# Patient Record
Sex: Male | Born: 1990 | Race: Black or African American | Hispanic: No | Marital: Single | Smoking: Never smoker
Health system: Southern US, Community
[De-identification: ages and names within clinical notes are randomized; demographics above are authoritative.]

## PROBLEM LIST (undated history)

## (undated) DIAGNOSIS — G43909 Migraine, unspecified, not intractable, without status migrainosus: Secondary | ICD-10-CM

---
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
## 2012-03-29 ENCOUNTER — Encounter (HOSPITAL_COMMUNITY): Payer: Self-pay | Admitting: *Deleted

## 2012-03-29 ENCOUNTER — Emergency Department (HOSPITAL_COMMUNITY): Payer: Self-pay

## 2012-03-29 ENCOUNTER — Emergency Department (HOSPITAL_COMMUNITY)
Admission: EM | Admit: 2012-03-29 | Discharge: 2012-03-30 | Disposition: A | Discharge status: Home / Self Care | Payer: Self-pay | Attending: Emergency Medicine | Admitting: Emergency Medicine

## 2012-03-29 DIAGNOSIS — S0510XA Contusion of eyeball and orbital tissues, unspecified eye, initial encounter: Secondary | ICD-10-CM | POA: Insufficient documentation

## 2012-03-29 DIAGNOSIS — S022XXA Fracture of nasal bones, initial encounter for closed fracture: Secondary | ICD-10-CM | POA: Insufficient documentation

## 2012-03-29 DIAGNOSIS — S0590XA Unspecified injury of unspecified eye and orbit, initial encounter: Secondary | ICD-10-CM

## 2012-03-29 DIAGNOSIS — S0530XA Ocular laceration without prolapse or loss of intraocular tissue, unspecified eye, initial encounter: Secondary | ICD-10-CM | POA: Insufficient documentation

## 2012-03-29 MED ORDER — IBUPROFEN 400 MG PO TABS
800.0000 mg | ORAL_TABLET | Freq: Once | ORAL | Status: AC
  Administered 2012-03-29: 800 mg via ORAL
  Filled 2012-03-29: qty 2

## 2012-03-29 MED ORDER — TETRACAINE HCL 0.5 % OP SOLN
2.0000 [drp] | Freq: Once | OPHTHALMIC | Status: AC
  Administered 2012-03-29: 2 [drp] via OPHTHALMIC
  Filled 2012-03-29: qty 2

## 2012-03-29 MED ORDER — OXYCODONE-ACETAMINOPHEN 5-325 MG PO TABS
1.0000 | ORAL_TABLET | Freq: Once | ORAL | Status: AC
  Administered 2012-03-29: 1 via ORAL
  Filled 2012-03-29: qty 1

## 2012-03-29 MED ORDER — OXYCODONE-ACETAMINOPHEN 5-325 MG PO TABS: 1.0000 | ORAL_TABLET | ORAL | Status: DC | PRN
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
## 2012-03-30 NOTE — ED Provider Notes (Signed)
Medical screening examination/treatment/procedure(s) were conducted as a shared visit with non-physician practitioner(s) and myself.  I personally evaluated the patient during the encounter. Right eye periorbital swelling and ecchymosis without hyphema, with significant soft tissue swelling, extraocular movements intact without entrapment, vision intact. Pain was addressed , Prescriptions provided, outpatient followup referrals provided - ophthalmology and maxillofacial on-call Dr. Emeline Darling. All of patient's questions answered and patient states understanding all discharge and followup instructions  Sunnie Nielsen, MD 03/30/12 [phone]

## 2012-04-01 ENCOUNTER — Encounter (HOSPITAL_COMMUNITY): Payer: Self-pay | Admitting: Nurse Practitioner

## 2012-04-01 ENCOUNTER — Emergency Department (HOSPITAL_COMMUNITY)
Admission: EM | Admit: 2012-04-01 | Discharge: 2012-04-01 | Disposition: A | Discharge status: Home / Self Care | Payer: Self-pay | Attending: Emergency Medicine | Admitting: Emergency Medicine

## 2012-04-01 DIAGNOSIS — R110 Nausea: Secondary | ICD-10-CM | POA: Insufficient documentation

## 2012-04-01 DIAGNOSIS — S0280XA Fracture of other specified skull and facial bones, unspecified side, initial encounter for closed fracture: Secondary | ICD-10-CM | POA: Insufficient documentation

## 2012-04-01 DIAGNOSIS — S0292XA Unspecified fracture of facial bones, initial encounter for closed fracture: Secondary | ICD-10-CM

## 2012-04-01 DIAGNOSIS — R519 Headache, unspecified: Secondary | ICD-10-CM

## 2012-04-01 DIAGNOSIS — S0993XA Unspecified injury of face, initial encounter: Secondary | ICD-10-CM | POA: Insufficient documentation

## 2012-04-01 MED ORDER — IBUPROFEN 400 MG PO TABS
600.0000 mg | ORAL_TABLET | Freq: Once | ORAL | Status: AC
  Administered 2012-04-01: 600 mg via ORAL
  Filled 2012-04-01: qty 1

## 2012-04-01 MED ORDER — HYDROMORPHONE HCL PF 1 MG/ML IJ SOLN
1.0000 mg | Freq: Once | INTRAMUSCULAR | Status: AC
  Administered 2012-04-01: 1 mg via INTRAMUSCULAR
  Filled 2012-04-01: qty 1

## 2012-04-01 MED ORDER — NAPROXEN 500 MG PO TABS: 500.0000 mg | ORAL_TABLET | Freq: Two times a day (BID) | ORAL | Status: DC | PRN

## 2012-04-01 MED ORDER — OXYCODONE-ACETAMINOPHEN 5-325 MG PO TABS: 1.0000 | ORAL_TABLET | ORAL | Status: DC | PRN
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
## 2012-12-14 ENCOUNTER — Encounter (HOSPITAL_COMMUNITY): Payer: Self-pay | Admitting: Emergency Medicine

## 2012-12-14 ENCOUNTER — Emergency Department (HOSPITAL_COMMUNITY)
Admission: EM | Admit: 2012-12-14 | Discharge: 2012-12-15 | Disposition: A | Discharge status: Home / Self Care | Payer: Self-pay | Attending: Emergency Medicine | Admitting: Emergency Medicine

## 2012-12-14 DIAGNOSIS — Y9367 Activity, basketball: Secondary | ICD-10-CM | POA: Insufficient documentation

## 2012-12-14 DIAGNOSIS — Y9239 Other specified sports and athletic area as the place of occurrence of the external cause: Secondary | ICD-10-CM | POA: Insufficient documentation

## 2012-12-14 DIAGNOSIS — Z23 Encounter for immunization: Secondary | ICD-10-CM | POA: Insufficient documentation

## 2012-12-14 DIAGNOSIS — S0181XA Laceration without foreign body of other part of head, initial encounter: Secondary | ICD-10-CM

## 2012-12-14 DIAGNOSIS — W219XXA Striking against or struck by unspecified sports equipment, initial encounter: Secondary | ICD-10-CM | POA: Insufficient documentation

## 2012-12-14 DIAGNOSIS — S058X9A Other injuries of unspecified eye and orbit, initial encounter: Secondary | ICD-10-CM | POA: Insufficient documentation

## 2012-12-14 MED ORDER — ACETAMINOPHEN 325 MG PO TABS
650.0000 mg | ORAL_TABLET | Freq: Once | ORAL | Status: AC
  Administered 2012-12-14: 650 mg via ORAL
  Filled 2012-12-14: qty 2

## 2012-12-14 MED ORDER — TETANUS-DIPHTH-ACELL PERTUSSIS 5-2.5-18.5 LF-MCG/0.5 IM SUSP
0.5000 mL | Freq: Once | INTRAMUSCULAR | Status: AC
  Administered 2012-12-14: 0.5 mL via INTRAMUSCULAR
  Filled 2012-12-14: qty 0.5

## 2012-12-14 NOTE — ED Notes (Signed)
Pt. presents with laceration at right lateral eyelid approx. 1/2 inch sustained while playing basketball ( hit a pole)  this evening . No LOC / ambulatory . Bleeding controlled. Alert and oriented .

## 2012-12-14 NOTE — ED Provider Notes (Signed)
CSN: [id number]     Arrival date & time 12/14/12  2114 History  This chart was scribed for non-physician practitioner Jaynie Crumble, PA-C, working with Layla Maw Ward, DO by Dorothey Baseman, ED Scribe. This patient was seen in room TR09C/TR09C and the patient's care was started at 11:03 PM.    Chief Complaint  Patient presents with  . Laceration   The history is provided by the patient. No language interpreter was used.   HPI Comments: Brad King is a 22 y.o. male who presents to the Emergency Department complaining of a laceration with associated mild pain to the right upper eyelid area that occurred while he was playing basketball and hit a pole PTA. There is no active bleeding at this time. He denies loss of consciousness or blurred vision. He reports associated headache. He states that he has not taken anything at home to treat the pain symptoms. Unknown tetanus. Nothing making his symptoms better or worse    History reviewed. No pertinent past medical history. History reviewed. No pertinent past surgical history. No family history on file. History  Substance Use Topics  . Smoking status: Never Smoker   . Smokeless tobacco: Not on file  . Alcohol Use: No    Review of Systems  Skin: Positive for wound.  Neurological: Positive for headaches. Negative for syncope.  All other systems reviewed and are negative.    Allergies  Review of patient's allergies indicates no known allergies.  Home Medications   Current Outpatient Rx  Name  Route  Sig  Dispense  Refill  . ibuprofen (ADVIL,MOTRIN) 200 MG tablet   Oral   Take 400 mg by mouth every 6 (six) hours as needed. For pain         . naproxen (NAPROSYN) 500 MG tablet   Oral   Take 1 tablet (500 mg total) by mouth 2 (two) times daily as needed.   20 tablet   0   . oxyCODONE-acetaminophen (PERCOCET/ROXICET) 5-325 MG per tablet   Oral   Take 1 tablet by mouth every 4 (four) hours as needed for pain.   15 tablet   0    . oxyCODONE-acetaminophen (PERCOCET/ROXICET) 5-325 MG per tablet   Oral   Take 1-2 tablets by mouth every 4 (four) hours as needed for pain.   30 tablet   0    Triage Vitals: BP 145/85  Pulse 95  Temp(Src) 98.8 F (37.1 C) (Oral)  Resp 20  SpO2 97%  Physical Exam  Nursing note and vitals reviewed. Constitutional: He is oriented to person, place, and time. He appears well-developed and well-nourished. No distress.  HENT:  Head: Normocephalic and atraumatic.  Eyes: Conjunctivae are normal.  Neck: Normal range of motion. Neck supple.  Musculoskeletal: Normal range of motion.  Neurological: He is alert and oriented to person, place, and time.  Skin: Skin is warm and dry.  1 cm laceration just below the right eyebrow that is non-gaping and hemostatic.   Psychiatric: He has a normal mood and affect. His behavior is normal.    ED Course  Procedures (including critical care time)  Medications  TDaP (BOOSTRIX) injection 0.5 mL (0.5 mLs Intramuscular Given 12/14/12 2339)  acetaminophen (TYLENOL) tablet 650 mg (650 mg Oral Given 12/14/12 2339)   DIAGNOSTIC STUDIES: Oxygen Saturation is 97% on room air, normal by my interpretation.    COORDINATION OF CARE: 11:04PM- Discussed that sutures will not be necessary and that the laceration will be closed with dermabond. Discussed  treatment plan with patient at bedside and patient verbalized agreement.    LACERATION REPAIR Performed by: Lottie Mussel Authorized by: Jaynie Crumble A Consent: Verbal consent obtained. Risks and benefits: risks, benefits and alternatives were discussed Consent given by: patient Patient identity confirmed: provided demographic data Prepped and Draped in normal sterile fashion Wound explored  Laceration Location: right eyebrow  Laceration Length: 1cm  No Foreign Bodies seen or palpated  Anesthesia: none  Irrigation method: syringe Amount of cleaning: standard  Skin closure: tissue  adhesive  Patient tolerance: Patient tolerated the procedure well with no immediate complications.  Labs Review Labs Reviewed - No data to display Imaging Review No results found.  MDM   1. Laceration of face, initial encounter     Patient with a superficial laceration to the right eyebrow. The wound was non-gaping and superficial. It was repaired using Dermabond. Patient has noted evidence of any major intracranial trauma. He is nontoxic appearing. He was given Tylenol for his headache. Tetanus was updated. He was discharged home with close followup as needed.  Filed Vitals:   12/14/12 2120  BP: 145/85  Pulse: 95  Temp: 98.8 F (37.1 C)  TempSrc: Oral  Resp: 20  SpO2: 97%   I personally performed the services described in this documentation, which was scribed in my presence. The recorded information has been reviewed and is accurate.   Lottie Mussel, PA-C 12/15/12 0005

## 2012-12-15 NOTE — ED Provider Notes (Signed)
Medical screening examination/treatment/procedure(s) were performed by non-physician practitioner and as supervising physician I was immediately available for consultation/collaboration.  Zimri Brennen N Chau Savell, DO 12/15/12 1457 

## 2014-02-04 ENCOUNTER — Emergency Department (HOSPITAL_COMMUNITY)
Admission: EM | Admit: 2014-02-04 | Discharge: 2014-02-04 | Disposition: A | Discharge status: Home / Self Care | Payer: Self-pay | Attending: Emergency Medicine | Admitting: Emergency Medicine

## 2014-02-04 ENCOUNTER — Encounter (HOSPITAL_COMMUNITY): Payer: Self-pay | Admitting: *Deleted

## 2014-02-04 DIAGNOSIS — G43909 Migraine, unspecified, not intractable, without status migrainosus: Secondary | ICD-10-CM | POA: Insufficient documentation

## 2014-02-04 DIAGNOSIS — J029 Acute pharyngitis, unspecified: Secondary | ICD-10-CM | POA: Insufficient documentation

## 2014-02-04 DIAGNOSIS — G43009 Migraine without aura, not intractable, without status migrainosus: Secondary | ICD-10-CM

## 2014-02-04 HISTORY — DX: Migraine, unspecified, not intractable, without status migrainosus: G43.909

## 2014-02-04 LAB — RAPID STREP SCREEN (MED CTR MEBANE ONLY): STREPTOCOCCUS, GROUP A SCREEN (DIRECT): NEGATIVE

## 2014-02-04 MED ORDER — DIPHENHYDRAMINE HCL 50 MG/ML IJ SOLN
25.0000 mg | Freq: Once | INTRAMUSCULAR | Status: AC
  Administered 2014-02-04: 25 mg via INTRAVENOUS
  Filled 2014-02-04: qty 1

## 2014-02-04 MED ORDER — SODIUM CHLORIDE 0.9 % IV SOLN: INTRAVENOUS | Status: DC

## 2014-02-04 MED ORDER — PROMETHAZINE HCL 25 MG/ML IJ SOLN
12.5000 mg | Freq: Once | INTRAMUSCULAR | Status: AC
  Administered 2014-02-04: 12.5 mg via INTRAVENOUS
  Filled 2014-02-04: qty 1

## 2014-02-04 MED ORDER — SODIUM CHLORIDE 0.9 % IV BOLUS (SEPSIS)
1000.0000 mL | Freq: Once | INTRAVENOUS | Status: AC
  Administered 2014-02-04: 1000 mL via INTRAVENOUS

## 2014-02-04 MED ORDER — KETOROLAC TROMETHAMINE 30 MG/ML IJ SOLN
30.0000 mg | Freq: Once | INTRAMUSCULAR | Status: AC
  Administered 2014-02-04: 30 mg via INTRAVENOUS
  Filled 2014-02-04: qty 1

## 2014-02-04 MED ORDER — DEXAMETHASONE SODIUM PHOSPHATE 10 MG/ML IJ SOLN
10.0000 mg | Freq: Once | INTRAMUSCULAR | Status: AC
  Administered 2014-02-04: 10 mg via INTRAVENOUS
  Filled 2014-02-04: qty 1

## 2014-02-04 NOTE — ED Provider Notes (Addendum)
CSN: [id number]     Arrival date & time 02/04/14  1843 History   First MD Initiated Contact with Patient 02/04/14 1954     Chief Complaint  Patient presents with  . Migraine  . Sore Throat     (Consider location/radiation/quality/duration/timing/severity/associated sxs/prior Treatment) Patient is a 23 y.o. male presenting with migraines and pharyngitis. The history is provided by the patient.  Migraine Pertinent negatives include no abdominal pain, no headaches and no shortness of breath.  Sore Throat Pertinent negatives include no abdominal pain, no headaches and no shortness of breath.  patient with history of migraines. Headache started 4 days ago. Right-sided typical for past migraines. Throbbing in nature no vomiting but has nausea. Associated with photophobia. Patient also concerned about swelling of the uvula. Uncomfortable when he swallows but not painful. Patient is getting over an upper respiratory infection. No fevers. No focal neuro deficits. Headache is throbbing in nature. 10 out of 10. Nonradiating. Now predominantly on the right occipital area. Patient's last migraine was 2 months ago.  Past Medical History  Diagnosis Date  . Migraines    History reviewed. No pertinent past surgical history. No family history on file. History  Substance Use Topics  . Smoking status: Never Smoker   . Smokeless tobacco: Not on file  . Alcohol Use: No    Review of Systems  Constitutional: Negative for fever.  HENT: Positive for congestion. Negative for sore throat.   Eyes: Positive for photophobia.  Respiratory: Negative for shortness of breath.   Gastrointestinal: Negative for nausea, vomiting and abdominal pain.  Genitourinary: Negative for dysuria.  Musculoskeletal: Negative for back pain.  Skin: Negative for rash.  Neurological: Negative for weakness, numbness and headaches.  Hematological: Does not bruise/bleed easily.  Psychiatric/Behavioral: Negative for confusion.       Allergies  Review of patient's allergies indicates no known allergies.  Home Medications   Prior to Admission medications   Not on File   BP 132/87 mmHg  Pulse 61  Temp(Src) 98.2 F (36.8 C) (Oral)  Resp 16  Ht 5\' 10"  (1.778 m)  Wt 240 lb (108.863 kg)  BMI 34.44 kg/m2  SpO2 98% Physical Exam  Constitutional: He is oriented to person, place, and time. He appears well-developed and well-nourished. No distress.  HENT:  Head: Normocephalic and atraumatic.  Mouth/Throat: Oropharynx is clear and moist. No oropharyngeal exudate.  Uvula without significant objective swelling.  Eyes: Conjunctivae and EOM are normal. Pupils are equal, round, and reactive to light.  Neck: Normal range of motion.  Cardiovascular: Normal rate.   No murmur heard. Pulmonary/Chest: Effort normal and breath sounds normal. No respiratory distress.  Abdominal: Soft. Bowel sounds are normal. There is no tenderness.  Musculoskeletal: Normal range of motion. He exhibits no edema.  Neurological: He is alert and oriented to person, place, and time. No cranial nerve deficit. He exhibits normal muscle tone. Coordination normal.  Skin: Skin is warm. No rash noted.  Nursing note and vitals reviewed.   ED Course  Procedures (including critical care time) Labs Review Labs Reviewed  RAPID STREP SCREEN  CULTURE, GROUP A STREP    Imaging Review No results found.   EKG Interpretation None      MDM   Final diagnoses:  Nonintractable migraine, unspecified migraine type    Patient with history of migraines. This migraine is typical. Right-sided present for 4 days. Currently 10 out of 10 throbbing in nature. Associated with photophobia no nausea no vomiting. No fevers. Treated with  migraine cocktail with some improvement. Patient is nontoxic no acute distress. Patient also with the subjective feeling of swelling to the uvula. Objectively it does not appear that swollen. But the Decadron used for the  migraine cocktail should help improve this. Patient is getting over an upper respiratory infection. No significant exudates or significant erythema or swelling in the tonsil area.  Toradol added on to the regiment. Patient with some improvement with the regular regiment. The LAD Toradol to try to get a little more immediate relief. Patient certainly no worse. No focal deficits. Patient nontoxic.  Vanetta MuldersScott Sabir Charters, MD 02/04/14 [phone]    Vanetta MuldersScott Gatlyn Lipari, MD 02/04/14 2209

## 2014-02-04 NOTE — ED Notes (Addendum)
Pt c/o R sided headache that increases with movement.  Pt also c/o vomiting and photophobia.  Pt also states his uvula is swollen and hurts when he swallows.  Redness noted, but no swelling.  Airway patent.

## 2014-02-04 NOTE — Discharge Instructions (Signed)
Migraine Headache A migraine headache is very bad, throbbing pain on one or both sides of your head. Talk to your doctor about what things may bring on (trigger) your migraine headaches. HOME CARE  Only take medicines as told by your doctor.  Lie down in a dark, quiet room when you have a migraine.  Keep a journal to find out if certain things bring on migraine headaches. For example, write down:  What you eat and drink.  How much sleep you get.  Any change to your diet or medicines.  Lessen how much alcohol you drink.  Quit smoking if you smoke.  Get enough sleep.  Lessen any stress in your life.  Keep lights dim if bright lights bother you or make your migraines worse. GET HELP RIGHT AWAY IF:   Your migraine becomes really bad.  You have a fever.  You have a stiff neck.  You have trouble seeing.  Your muscles are weak, or you lose muscle control.  You lose your balance or have trouble walking.  You feel like you will pass out (faint), or you pass out.  You have really bad symptoms that are different than your first symptoms. MAKE SURE YOU:   Understand these instructions.  Will watch your condition.  Will get help right away if you are not doing well or get worse. Document Released: 12/23/2007 Document Revised: 06/07/2011 Document Reviewed: 11/20/2012 [facility] Patient Information 2015 [employer], [place]. This information is not intended to replace advice given to you by your health care provider. Make sure you discuss any questions you have with your health care provider.   Emergency Department Resource Guide 1) Find a Doctor and Pay Out of Pocket Although you won't have to find out who is covered by your insurance plan, it is a good idea to ask around and get recommendations. You will then need to call the office and see if the doctor you have chosen will accept you as a new patient and what types of options they offer for patients who are self-pay. Some doctors  offer discounts or will set up payment plans for their patients who do not have insurance, but you will need to ask so you aren't surprised when you get to your appointment.  2) Contact Your Local Health Department Not all health departments have doctors that can see patients for sick visits, but many do, so it is worth a call to see if yours does. If you don't know where your local health department is, you can check in your phone book. The CDC also has a tool to help you locate your state's health department, and many state websites also have listings of all of their local health departments.  3) Find a Walk-in Clinic If your illness is not likely to be very severe or complicated, you may want to try a walk in clinic. These are popping up all over the country in pharmacies, drugstores, and shopping centers. They're usually staffed by nurse practitioners or physician assistants that have been trained to treat common illnesses and complaints. They're usually fairly quick and inexpensive. However, if you have serious medical issues or chronic medical problems, these are probably not your best option.  No Primary Care Doctor: - Call Health Connect at  [phone] - they can help you locate a primary care doctor that  accepts your insurance, provides certain services, etc. - Physician Referral Service- [phone]  Chronic Pain Problems: Organization         Address  Phone   Notes  °[facility]  (336) 297-2271 Patients need to be referred by their primary care doctor.  ° °Medication Assistance: °Organization         Address  Phone   Notes  °[place] [facility] [address] °[place], [place] [address] (336) 641-8030 --Must be a resident of [place] °-- Must have NO insurance coverage whatsoever (no Medicaid/ Medicare, etc.) °-- The pt. MUST have a primary care doctor that directs their care regularly and follows them in the community °   °[facility]  (866) 331-1348   °[employer] Way  (888) 892-1162   ° °Agencies that provide inexpensive medical care: °Organization         Address  Phone   Notes  °[facility]  (336) 832-8035   °[facility]    (336) 832-7272   °[facility] [address] °[place], [place] [address] (336) 832-4777   °[facility] [address], °[place] (336) 271-4999   °[facility]    (336) 373-0678   °[facility]    (336) 272-1050   °[facility] ° [address], [place] Phone:  (336) 832-4444, Fax:  (336) 832-4440 Hours of Operation:  9 am - 6 pm, M-F.  Also accepts Medicaid/Medicare and self-pay.  °[facility] ° [address], [place] Phone: (336) 832-3150, Fax: (336) 832-3151. Hours of Operation:  8:30 am - 5:30 pm, M-F.  Also accepts Medicaid and self-pay.  °[facility] [place] [address], [place] Phone: (336) 878-6027   °[facility] [address], [place], [place] (336)723-1848, Ext. 123 Mondays & Thursdays: 7-9 AM.  First 15 patients are seen on a first come, first serve basis. °  ° °Medicaid-accepting [place] Providers: ° °Organization         Address  Phone   Notes  °[facility] [address], [place] (336) 641-2100 Also accepts self-pay patients.  °[facility] [address], [place] ° (336) 856-9996   °[facility] [address], [place] (336) 288-8857   °[facility] [address] [place] [address], [place] (336) 299-7000   °Veita Bland [address], [place]  ° (336) 373-1557 Only accepts [place] Access Medicaid patients after they have their name applied to their card.  ° °Self-Pay (no insurance) in [place]: ° °Organization         Address  Phone   Notes  °Sickle Cell Patients, [facility] [address], [place] (336) 832-1970   °[facility] [address], [place] (336) 832-4400   °[facility] ° [address], [place] (336) 992-4800   °[facility]/Dr. Osei-Bonsu ° [address] [place] [address], [place] or [address], [place] (336) 841-8500 Phone number for both [place] and [place] locations is the same.  °[facility] [address], [place] (336) 299-0000   °[facility]  [address] [place] [address],  or [address] (336) 852-7530 °(336) 878-2260   °[facility] [address],  (336) 350-1642, phone; (336) 294-5005, fax Sees patients 1st and 3rd Saturday of every month.  Must not qualify for public   or private insurance (i.e. Medicaid, Medicare, [place] Health Choice, Veterans' Benefits)  Household income should be no more than 200% of the poverty level The clinic cannot treat you if you are pregnant or think you are pregnant  Sexually transmitted diseases are not treated at the clinic.    Dental Care: Organization         Address  Phone  Notes  [facility] [place] [phone] Accepts children up to age 66 who are enrolled in [place] or [place]; pregnant women with a Medicaid card; and children who have applied for Medicaid or [place] Health Choice, but were declined, whose parents can pay a reduced fee at time of service.  [facility]  [address], [place] (361) 499-3467 Accepts children up to age 38 who are enrolled in [place] or [place]; pregnant women with a Medicaid card; and children who have applied for Medicaid or [place] Health Choice, but were declined, whose parents can pay a reduced fee at time of service.  [facility]  [place] (501)159-6478 Patients are seen by appointment only. Walk-ins are not accepted. [place] will see patients 32 years of age and older. Monday - Tuesday (8am-5pm) Most Wednesdays (8:30-5pm) $30 per visit, cash only  [facility]  [address], [facility] [phone] Patients are seen by appointment only. Walk-ins are not accepted. [place] will see patients 40 years of age and older. One Wednesday Evening (Monthly: Volunteer Based).  $30 per visit, cash only  [place]  [phone] for adults; Children under age 59, call Graduate Pediatric Dentistry at ([phone]. Children aged 66-14, please call [phone] to request a pediatric application.  Dental services are provided in all areas of dental care including fillings, crowns and bridges, complete and partial dentures, implants, gum treatment, root canals, and extractions. Preventive care is also provided. Treatment is provided to both adults and children. Patients are selected via a lottery and there is often a waiting list.   [facility] [address], [place]  (804)[phone] [email or web address]   [facility] [address] [place], [place] [phone], Ext. 123 Second and Fourth Thursday of each month, opens at 6:30 AM; Clinic ends at 9 AM.  Patients are seen on a first-come first-served basis, and a limited number are seen during each clinic.   [facility]  [address] [place] D[place], [place] [phone]   Eligibility Requirements You must have lived in [place], [place], or [place] counties for at least the last three months.   You cannot be eligible for state or federal sponsored [employer], including [employer], [place], or [employer].   You generally cannot be eligible for healthcare insurance through your employer.    How to apply: Eligibility screenings are held every Tuesday and  Wednesday afternoon from 1:00 pm until 4:00 pm. You do not need an appointment for the interview!  [facility] [address], [place], [place]   [place]  [place] Department  [place]  [phone]    Behavioral Health Resources in the Community: Intensive Outpatient Programs Organization         Address  Phone  Notes  [facility] [address], [place], [place] 336-878-6098   °[facility] [address], [place], [place] 336-832-9800   °[facility] [address], [place], [place] ° 336-882-2125   °[facility] [address],  °[place], [place] 1-800-853-5163 or 336-641-4981   °Substance Abuse Resources °Organization         Address  Phone  Notes  °[facility]  336-882-2125   °[facility]  336-784-9470   °[facility]  336-285-9073   °[facility]  336-845-3988   °Residential & Outpatient Substance Abuse Program  1-800-659-3381   °Psychological Services °Organization         Address  Phone  Notes  °[facility]  336- 832-9600   °[facility]  336- 378-7881   °[facility] [address], [place] 1-800-853-5163 or 336-641-4981   ° °Mobile Crisis Teams °Organization         Address  Phone  Notes  °[facility], Mobile Crisis Care Unit  1-877-626-1772   °Assertive °[facility] ° [address] [place], [place] 336-834-9664   °Sharon DeEsch [address] °[place] 336-554-5454   ° °Self-Help/Support Groups °Organization         Address  Phone             Notes  °[facility] [place] - variety of support groups  336- 373-1402 Call for more information  °Narcotics Anonymous (NA), [facility] [address], °[place]  2 meetings at this location  ° °Residential  Treatment Programs °Organization         Address  Phone  Notes  °[facility] [address],    °[place]  1-866-801-8205   °[facility] ° [address], [place], [place] 704-293-8524   °[facility] [address], [place] 336-845-3988 Admissions: 8am-3pm M-F  °[facility] [address]    °[place], [place] 336-841-1104   °[facility] [address], [place], [place] 336-379-7146   °[facility] [address]  °[place], [place] 336-285-9073   °[facility] - Intensive Outpatient [address], [place], [place] 336-852-3033   °[facility] [address]  °[place], [place] 1-877-615-2722 or 336-784-9470   °[facility] ([facility]) [address] [place], [place] 336-227-7417 Accepts Medicaid  °[facility] [address]  ° [place] 1-800-659-3381 Substance Abuse/Addiction Treatment  ° °[place] Behavioral Health Resources °Organization         Address  Phone  Notes  °[facility]  (888) 581-9988   °Julie Brannon, PhD [address] [place], [place]   (336) 349-5553 or (336) 951-0000   °[facility]   [address] °[place], [place] (336) 349-4454   °[facility] [address], [place], [place] (336) 342-8316 Insurance/Medicaid/sponsorship through [facility]  °[facility] [address]                                    [place], [place] (336) 342-8316 Therapy/tele-psych/case  °[facility] [address]  ° , [place] (336) 349-2233    °Dr. Arfeen  (336) 349-4544   °[facility] [place]  [employer] [place] [facility]   Dept. 1) [address], [place] 2) [address], [place] 3)  [address], [place] [phone](336) 270-377-4389 [phone](336) 205-596-6782  850-804-4398(336) [phone]   [facility] Child Abuse Hotline 716 085 6661(336) (519)063-1769 or (251)440-2096(336) 506-489-2636 (After Hours)       Information and resource guide provided above. Go home and sleep is much as possible. Return for any new or worse symptoms. Would recommend follow-up with the wellness clinic as listed in the information above.

## 2014-02-06 LAB — CULTURE, GROUP A STREP

## 2015-05-12 ENCOUNTER — Encounter (HOSPITAL_COMMUNITY): Payer: Self-pay | Admitting: Emergency Medicine

## 2015-05-12 ENCOUNTER — Emergency Department (HOSPITAL_COMMUNITY)
Admission: EM | Admit: 2015-05-12 | Discharge: 2015-05-12 | Disposition: A | Discharge status: Home / Self Care | Payer: Self-pay | Attending: Emergency Medicine | Admitting: Emergency Medicine

## 2015-05-12 DIAGNOSIS — Z8679 Personal history of other diseases of the circulatory system: Secondary | ICD-10-CM | POA: Insufficient documentation

## 2015-05-12 DIAGNOSIS — M62830 Muscle spasm of back: Secondary | ICD-10-CM | POA: Insufficient documentation

## 2015-05-12 MED ORDER — NAPROXEN 500 MG PO TABS: 500.0000 mg | ORAL_TABLET | Freq: Two times a day (BID) | ORAL | Status: DC

## 2015-05-12 MED ORDER — METHOCARBAMOL 500 MG PO TABS: 500.0000 mg | ORAL_TABLET | Freq: Two times a day (BID) | ORAL | Status: DC

## 2015-05-12 NOTE — ED Notes (Signed)
Pt states he was wrestling with daughter last night and then started having a pain in his back on the right lower to mid side. Pt states it "feels like i pulled a muscle".

## 2015-05-12 NOTE — ED Notes (Signed)
Declined W/C at D/C and was escorted to lobby by RN. 

## 2015-05-12 NOTE — ED Provider Notes (Signed)
CSN: [id number]     Arrival date & time 05/12/15  1403 History  By signing my name below, I, Elon Spanner, attest that this documentation has been prepared under the direction and in the presence of Felicie Morn, NP. Electronically Signed: Elon Spanner ED Scribe. 05/12/2015. 4:11 PM.    Chief Complaint  Patient presents with  . Back Pain   The history is provided by the patient. No language interpreter was used.   HPI Comments: Brad King is a 25 y.o. male with no chronic conditions who presents to the Emergency Department complaining of constant, moderate, sharp right lower back pain onset last night after wrestling with is daughter.  The pain is worse with deep inspiration, bending, and twisting.  The patient takes no medication regularly.  He denies central back pain, numbness/tingling, cough, congestion, sore throat, abdominal pain, n/v.   PCP: none  Past Medical History  Diagnosis Date  . Migraines    History reviewed. No pertinent past surgical history. No family history on file. Social History  Substance Use Topics  . Smoking status: Never Smoker   . Smokeless tobacco: None  . Alcohol Use: No    Review of Systems  Musculoskeletal: Positive for myalgias and back pain.  All other systems reviewed and are negative.  A complete 10 system review of systems was obtained and all systems are negative except as noted in the HPI and PMH.    Allergies  Review of patient's allergies indicates no known allergies.  Home Medications   Prior to Admission medications   Not on File   BP 148/95 mmHg  Pulse 82  Temp(Src) 98 F (36.7 C) (Oral)  Resp 16  Ht  (1.753 m)  Wt 240 lb (108.863 kg)  BMI 35.43 kg/m2  SpO2 98% Physical Exam  Constitutional: He is oriented to person, place, and time. He appears well-developed and well-nourished. No distress.  HENT:  Head: Normocephalic and atraumatic.  Eyes: Conjunctivae and EOM are normal.  Neck: Neck supple. No tracheal  deviation present.  Cardiovascular: Normal rate.   Pulmonary/Chest: Effort normal. No respiratory distress.  Musculoskeletal: Normal range of motion.  Muscle tension and spasm to the right of the lumbar spine.    Neurological: He is alert and oriented to person, place, and time.  Skin: Skin is warm and dry.  Psychiatric: He has a normal mood and affect. His behavior is normal.  Nursing note and vitals reviewed.   ED Course  Procedures   DIAGNOSTIC STUDIES: Oxygen Saturation is 98% on RA, normal by my interpretation.    COORDINATION OF CARE:  4:16 PM Will prescribe muscle relaxer and anti-inflammatory.  Patient may use heat and ice.  Patient acknowledges and agrees with plan.    Labs Review Labs Reviewed - No data to display  Imaging Review No results found. I have personally reviewed and evaluated these images and lab results as part of my medical decision-making.   EKG Interpretation None      MDM   Final diagnoses:  None    Low back pain with spasm. No red flag symptoms. Muscle relaxant and anti-inflammatory. Care instructions provided. Return precautions discussed.  I personally performed the services described in this documentation, which was scribed in my presence. The recorded information has been reviewed and is accurate.    Felicie Morn, NP 05/13/15 0865  Glynn Octave, MD 05/13/15 [phone]

## 2015-05-12 NOTE — Discharge Instructions (Signed)

## 2015-07-23 ENCOUNTER — Encounter (HOSPITAL_COMMUNITY): Payer: Self-pay | Admitting: *Deleted

## 2015-07-23 ENCOUNTER — Emergency Department (HOSPITAL_COMMUNITY)
Admission: EM | Admit: 2015-07-23 | Discharge: 2015-07-23 | Disposition: A | Discharge status: Home / Self Care | Payer: Self-pay | Attending: Emergency Medicine | Admitting: Emergency Medicine

## 2015-07-23 DIAGNOSIS — K0889 Other specified disorders of teeth and supporting structures: Secondary | ICD-10-CM | POA: Insufficient documentation

## 2015-07-23 DIAGNOSIS — Z791 Long term (current) use of non-steroidal anti-inflammatories (NSAID): Secondary | ICD-10-CM | POA: Insufficient documentation

## 2015-07-23 DIAGNOSIS — Z8679 Personal history of other diseases of the circulatory system: Secondary | ICD-10-CM | POA: Insufficient documentation

## 2015-07-23 DIAGNOSIS — Z79899 Other long term (current) drug therapy: Secondary | ICD-10-CM | POA: Insufficient documentation

## 2015-07-23 MED ORDER — PENICILLIN V POTASSIUM 500 MG PO TABS: 500.0000 mg | ORAL_TABLET | Freq: Four times a day (QID) | ORAL | Status: DC

## 2015-07-23 MED ORDER — IBUPROFEN 800 MG PO TABS: 800.0000 mg | ORAL_TABLET | Freq: Three times a day (TID) | ORAL | Status: DC

## 2015-07-23 MED ORDER — IBUPROFEN 400 MG PO TABS
800.0000 mg | ORAL_TABLET | Freq: Once | ORAL | Status: AC
  Administered 2015-07-23: 800 mg via ORAL
  Filled 2015-07-23: qty 2

## 2015-07-23 MED ORDER — PENICILLIN V POTASSIUM 250 MG PO TABS
500.0000 mg | ORAL_TABLET | Freq: Once | ORAL | Status: AC
  Administered 2015-07-23: 500 mg via ORAL
  Filled 2015-07-23: qty 2

## 2015-07-23 NOTE — ED Provider Notes (Signed)
CSN: [id number]     Arrival date & time 07/23/15  1503 History  By signing my name below, I, Tanda RockersMargaux Venter, attest that this documentation has been prepared under the direction and in the presence of Glean HessElizabeth Westfall, [address]. Electronically Signed: Tanda RockersMargaux Venter, ED Scribe. 07/23/2015. 3:24 PM.   Chief Complaint  Patient presents with  . Dental Pain    The history is provided by the patient. No language interpreter was used.     HPI Comments: Jaynie Crumblenthony Stoltz is a 25 y.o. male who presents to the Emergency Department complaining of gradual onset, intermittent, right upper dental pain x 2 weeks, constant since last night. The pain is exacerbated with exposure to cold air. He also complains of right facial swelling. Pt has been taking tylenol without relief. He has never had symptoms like this in the past. He does not currently have a dentist. Denies fever, chills, difficulty swallowing, trouble handling secretions, ear pain, or any other associated symptoms.    Past Medical History  Diagnosis Date  . Migraines    History reviewed. No pertinent past surgical history. No family history on file. Social History  Substance Use Topics  . Smoking status: Never Smoker   . Smokeless tobacco: None  . Alcohol Use: No    Review of Systems  Constitutional: Negative for fever and chills.  HENT: Positive for dental problem and facial swelling. Negative for drooling, ear pain and trouble swallowing.     Allergies  Review of patient's allergies indicates no known allergies.  Home Medications   Prior to Admission medications   Medication Sig Start Date End Date Taking? Authorizing Provider  ibuprofen (ADVIL,MOTRIN) 800 MG tablet Take 1 tablet (800 mg total) by mouth 3 (three) times daily. 07/23/15   Mady GemmaElizabeth C [place], PA-C  methocarbamol (ROBAXIN) 500 MG tablet Take 1 tablet (500 mg total) by mouth 2 (two) times daily. 05/12/15   Felicie Mornavid Smith, NP  naproxen (NAPROSYN) 500 MG tablet Take 1 tablet  (500 mg total) by mouth 2 (two) times daily. 05/12/15   Felicie Mornavid Smith, NP  penicillin v potassium (VEETID) 500 MG tablet Take 1 tablet (500 mg total) by mouth 4 (four) times daily. 07/23/15   Elizabeth C [place], PA-C    BP 164/92 mmHg  Pulse 70  Temp(Src) 98.6 F (37 C)  Resp 16  SpO2 100% Physical Exam  Constitutional: He is oriented to person, place, and time. He appears well-developed and well-nourished. No distress.  HENT:  Head: Normocephalic and atraumatic.  Right Ear: External ear normal.  Left Ear: External ear normal.  Nose: Nose normal.  Mouth/Throat: Oropharynx is clear and moist. No dental abscesses.  Erythema and TTP to gumline of right upper 2nd molar. No swelling to floor of mouth. No obvious abscess.   Eyes: Conjunctivae and EOM are normal. Pupils are equal, round, and reactive to light. Right eye exhibits no discharge. Left eye exhibits no discharge. No scleral icterus.  Neck: Normal range of motion. Neck supple.  Cardiovascular: Normal rate and regular rhythm.   Pulmonary/Chest: Effort normal and breath sounds normal. No respiratory distress.  Musculoskeletal: Normal range of motion. He exhibits no edema or tenderness.  Lymphadenopathy:    He has no cervical adenopathy.  Neurological: He is alert and oriented to person, place, and time.  Skin: Skin is warm and dry. He is not diaphoretic.  Psychiatric: He has a normal mood and affect. His behavior is normal.  Nursing note and vitals reviewed.   ED Course  Procedures (including critical care time)  DIAGNOSTIC STUDIES: Oxygen Saturation is 100% on RA, normal by my interpretation.    COORDINATION OF CARE: 3:20 PM-Discussed treatment plan which includes Rx antibiotics with pt at bedside and pt agreed to plan.   Labs Review Labs Reviewed - No data to display  Imaging Review No results found.    EKG Interpretation None       MDM   Final diagnoses:  Pain, dental    25 year old male presents with  right upper dental pain for the past 2 weeks. States his symptoms have been constant since last night, prompting him to come to the ED. Denies fever, chills, difficulty swallowing, trouble handling his secretions. Patient is afebrile. Vital signs stable. On exam, he has erythema and TTP to the gumline of his right upper second molar. No obvious abscess amenable to incision and drainage. No swelling to floor of mouth to suggest ludwig's angina. No significant facial swelling. Will treat with penicillin. Advised to take tylenol or ibuprofen for pain. Patient to follow up with dentist, resource list given. Return precautions discussed. Patient verbalizes his understanding and is in agreement with plan.  BP 164/92 mmHg  Pulse 70  Temp(Src) 98.6 F (37 C)  Resp 16  SpO2 100%  I personally performed the services described in this documentation, which was scribed in my presence. The recorded information has been reviewed and is accurate.      Mady Gemma, PA-C 07/23/15 1527  Tilden Fossa, MD 07/25/15 [phone]

## 2015-07-23 NOTE — ED Notes (Signed)
Pt is here for right upper dental pain x2 weeks.  Intermittent.  No fever or chill with this

## 2015-07-23 NOTE — Discharge Instructions (Signed)
1. Medications: penicillin, tylenol or ibuprofen for pain, usual home medications 2. Treatment: rest, drink plenty of fluids 3. Follow Up: please followup with your dentist for discussion of your diagnoses and further evaluation after today's visit; if you do not have a primary care doctor use the phone number listed in your discharge paperwork to find one; please return to the ER for high fever, difficulty swallowing, drooling, new or worsening symptoms  [employer] 211 is a great source of information about community services available.  Access by dialing 2-1-1 from anywhere in [place], or by website -  PooledIncome.plwww.nc211.org.   Other Local Resources (Updated 03/2015)  Dental  Care   Services    Phone Number and Address  Cost  [facility] For children 190 - 24 years of age:   Cleaning  Tooth brushing/flossing instruction  Sealants, fillings, crowns  Extractions  Emergency treatment  [phone] [address] [place], [place] [address] Charges based on family income.  Medicaid and some insurance plans accepted.     [facility]  Cleaning  Sealants, fillings, crowns  Extractions  Emergency treatment [phone] [address] [place], [place]  Pregnant women 24 years of age or older with a Medicaid card  [facility] [place]  Cleaning  Sealants, fillings, crowns  Extractions  Emergency treatment [phone] [address] [place], [place] Pregnant women 24 years of age or older with a Medicaid card  [facility] For children 690 - 24 years of age:   Cleaning  Tooth brushing/flossing instruction  Sealants, fillings, crowns  Extractions  Emergency treatment Limited orthodontic services for patients with Medicaid [phone] [address] [place], [place] [address] Medicaid and [facility] Health Choice cover for children up to age 24 and pregnant women.  Parents of children up to age 24 without Medicaid pay a reduced fee at time of service.  [facility] [place] For children 240 - 521 years of age:   Cleaning  Tooth brushing/flossing instruction  Sealants, fillings, crowns  Extractions  Emergency treatment Limited orthodontic services for patients with Medicaid [phone] [address] [place], [place].  Medicaid and [place] Health Choice cover for children up to age 24 and pregnant women.  Parents of children up to age 24 without Medicaid pay a reduced fee.  [facility]  Cleaning  Sealants, fillings, crowns  Extractions  Hours: Tuesdays and Thursdays, 4:15 - 8 pm [phone] [address] [place], [place] [address] Services free of charge to [facility] residents ages 18-64 who do not have health insurance, Medicare, [place], or [place] benefits and fall within federal poverty guidelines  [facility]    Provides dental care in addition to primary medical care, nutritional counseling, and pharmacy:  Nurse, mental healthCleaning  Sealants, fillings, crowns  Extractions                  [phone] [facility], [address] [place], [place]  [phone] [facility], [address] [place], [place]  [phone] [facility] [place], [place]  [phone] [facility], [address] [place], [place]  [phone] [facility] [address] [place], [place] Accepts [place], [place]are, most insurance.  Also provides services available to  all with fees adjusted based on ability to pay.    [facility]  Cleaning  Tooth brushing/flossing instruction  Sealants,  fillings, crowns  Extractions  Emergency treatment Hours: Tuesdays, Thursdays, and Fridays from 8 am to 5 pm by appointment only. [phone] [address] [place], [place] [address] [facility] residents with Medicaid (depending on eligibility) and children with [facility] Health Choice - call for more information.  [facility]  Extractions only  Hours: 2nd and 4th Thursday of each month from 6:30 am - 9 am.   [phone] [address] [place], [place] [address] Ages 73 and older only.  Patients are seen on a first come, first served basis.  [employer]  [employer]  Extractions  Orthodontics  Endodontics  Implants/Crowns/Bridges  Complete and partial dentures [phone] [place], [place] Patients must complete an application for services.  There is often a waiting list.

## 2015-12-19 ENCOUNTER — Emergency Department (HOSPITAL_COMMUNITY)
Admission: EM | Admit: 2015-12-19 | Discharge: 2015-12-19 | Disposition: A | Discharge status: Home / Self Care | Payer: Self-pay | Attending: Emergency Medicine | Admitting: Emergency Medicine

## 2015-12-19 ENCOUNTER — Encounter (HOSPITAL_COMMUNITY): Payer: Self-pay

## 2015-12-19 DIAGNOSIS — Z79899 Other long term (current) drug therapy: Secondary | ICD-10-CM | POA: Insufficient documentation

## 2015-12-19 DIAGNOSIS — J0110 Acute frontal sinusitis, unspecified: Secondary | ICD-10-CM | POA: Insufficient documentation

## 2015-12-19 MED ORDER — OXYMETAZOLINE HCL 0.05 % NA SOLN: 1.0000 | Freq: Two times a day (BID) | NASAL | 0 refills | Status: DC

## 2015-12-19 MED ORDER — GUAIFENESIN 100 MG/5ML PO LIQD: 100.0000 mg | ORAL | 0 refills | Status: DC | PRN

## 2015-12-19 NOTE — ED Provider Notes (Signed)
[facility]-EMERGENCY DEPT Provider Note   CSN: [id number] Arrival date & time: 12/19/15  1353   By signing my name below, I, Christel Mormon, attest that this documentation has been prepared under the direction and in the presence of Brad Pel, PA-C. Electronically Signed: Christel Mormon, Scribe. 12/19/2015. 2:36 PM.   History   Chief Complaint Chief Complaint  Patient presents with  . Nasal Congestion    The history is provided by the patient. No language interpreter was used.   HPI Comments:  Brad King is a 25 y.o. male who presents to the Emergency Department complaining of constant, worsening nasal congestion x3 days. Pt states that symptoms were worse when he woke up this morning. Pt complains of associated R-sided facial swelling, generalized body aches, dry cough, mild sore throat, and rhinorrhea. Pt has taken tylenol that provided some relief for the headache. Pt denies fever, dental pain, vomiting. He has not had any N/D, CP, SOB, back pain, dysuria, weakness. He is well appearing and is requesting a work note.  Past Medical History:  Diagnosis Date  . Migraines     There are no active problems to display for this patient.   History reviewed. No pertinent surgical history.     Home Medications    Prior to Admission medications   Medication Sig Start Date End Date Taking? Authorizing Provider  guaiFENesin (ROBITUSSIN) 100 MG/5ML liquid Take 5-10 mLs (100-200 mg total) by mouth every 4 (four) hours as needed for cough. 12/19/15   Ashla Murph Neva Seat, PA-C  ibuprofen (ADVIL,MOTRIN) 800 MG tablet Take 1 tablet (800 mg total) by mouth 3 (three) times daily. 07/23/15   Mady Gemma, PA-C  methocarbamol (ROBAXIN) 500 MG tablet Take 1 tablet (500 mg total) by mouth 2 (two) times daily. 05/12/15   Felicie Morn, NP  naproxen (NAPROSYN) 500 MG tablet Take 1 tablet (500 mg total) by mouth 2 (two) times daily. 05/12/15   Felicie Morn, NP  oxymetazoline (AFRIN NASAL SPRAY) 0.05  % nasal spray Place 1 spray into both nostrils 2 (two) times daily. 12/19/15   Brad Pel, PA-C  penicillin v potassium (VEETID) 500 MG tablet Take 1 tablet (500 mg total) by mouth 4 (four) times daily. 07/23/15   Mady Gemma, PA-C    Family History History reviewed. No pertinent family history.  Social History Social History  Substance Use Topics  . Smoking status: Never Smoker  . Smokeless tobacco: Never Used  . Alcohol use No     Allergies   Review of patient's allergies indicates no known allergies.   Review of Systems Review of Systems  Constitutional: Negative for fever.  HENT: Positive for congestion, facial swelling, rhinorrhea, sinus pressure and sore throat. Negative for dental problem.   Respiratory: Positive for cough.   Gastrointestinal: Negative for vomiting.  Musculoskeletal: Negative for myalgias.     Physical Exam Updated Vital Signs BP 154/93 (BP Location: Left Arm)   Pulse 68   Temp 98.4 F (36.9 C) (Oral)   Resp 18   SpO2 99%   Physical Exam  Constitutional: He appears well-developed and well-nourished. No distress.  HENT:  Head: Normocephalic and atraumatic. Head is without right periorbital erythema and without left periorbital erythema.  Right Ear: Tympanic membrane and ear canal normal.  Left Ear: Tympanic membrane and ear canal normal.  Nose: Rhinorrhea and sinus tenderness present. Right sinus exhibits maxillary sinus tenderness and frontal sinus tenderness. Left sinus exhibits no frontal sinus tenderness.  Eyes: Conjunctivae are normal.  Neck: Normal range of motion and full passive range of motion without pain. No Brudzinski's sign and no Kernig's sign noted.  Cardiovascular: Normal rate.   Pulmonary/Chest: Effort normal and breath sounds normal. He has no decreased breath sounds. He has no wheezes.  Abdominal: He exhibits no distension.  Neurological: He is alert.  Skin: Skin is warm and dry.  Psychiatric: He has a normal  mood and affect.  Nursing note and vitals reviewed.    ED Treatments / Results  DIAGNOSTIC STUDIES:  Oxygen Saturation is 99% on RA, normal by my interpretation.    COORDINATION OF CARE:  2:36 PM Discussed treatment plan with pt at bedside and pt agreed to plan.   Labs (all labs ordered are listed, but only abnormal results are displayed) Labs Reviewed - No data to display  EKG  EKG Interpretation None       Radiology No results found.  Procedures Procedures (including critical care time)  Medications Ordered in ED Medications - No data to display   Initial Impression / Assessment and Plan / ED Course  I have reviewed the triage vital signs and the nursing notes.  Pertinent labs & imaging results that were available during my care of the patient were reviewed by me and considered in my medical decision making (see chart for details).  Clinical Course    Patient with symptoms of sinusitis, it has only been 3 days, he is without fever and is well appearing. Will use decongestants for now and have him follow-up with ENT as needed or his PCP. Discussed that if symptoms persist or he develops fever he may need to start antibiotics. Discussed return precautions.  Final Clinical Impressions(s) / ED Diagnoses   Final diagnoses:  Acute frontal sinusitis, recurrence not specified    New Prescriptions New Prescriptions   GUAIFENESIN (ROBITUSSIN) 100 MG/5ML LIQUID    Take 5-10 mLs (100-200 mg total) by mouth every 4 (four) hours as needed for cough.   OXYMETAZOLINE (AFRIN NASAL SPRAY) 0.05 % NASAL SPRAY    Place 1 spray into both nostrils 2 (two) times daily.  I personally performed the services described in this documentation, which was scribed in my presence. The recorded information has been reviewed and is accurate.    Brad Peliffany Khyrie Masi, PA-C 12/19/15 1448    Shaune Pollackameron Isaacs, MD 12/20/15 [id number]

## 2015-12-19 NOTE — ED Triage Notes (Signed)
Pt has had sinus congestion since Tuesday.  Worse today with nasal swelling.  Unknown for fever.

## 2017-10-18 ENCOUNTER — Emergency Department (HOSPITAL_COMMUNITY): Payer: Self-pay

## 2017-10-18 ENCOUNTER — Other Ambulatory Visit: Payer: Self-pay

## 2017-10-18 ENCOUNTER — Emergency Department (HOSPITAL_COMMUNITY)
Admission: EM | Admit: 2017-10-18 | Discharge: 2017-10-19 | Disposition: A | Discharge status: Home / Self Care | Payer: Self-pay | Attending: Emergency Medicine | Admitting: Emergency Medicine

## 2017-10-18 ENCOUNTER — Encounter (HOSPITAL_COMMUNITY): Payer: Self-pay | Admitting: *Deleted

## 2017-10-18 DIAGNOSIS — M542 Cervicalgia: Secondary | ICD-10-CM | POA: Insufficient documentation

## 2017-10-18 DIAGNOSIS — J029 Acute pharyngitis, unspecified: Secondary | ICD-10-CM | POA: Insufficient documentation

## 2017-10-18 DIAGNOSIS — R1310 Dysphagia, unspecified: Secondary | ICD-10-CM | POA: Insufficient documentation

## 2017-10-18 LAB — I-STAT CHEM 8, ED
BUN: 13 mg/dL (ref 6–20)
CHLORIDE: 102 mmol/L (ref 98–111)
Calcium, Ion: 1.2 mmol/L (ref 1.15–1.40)
Creatinine, Ser: 0.9 mg/dL (ref 0.61–1.24)
Glucose, Bld: 89 mg/dL (ref 70–99)
HEMATOCRIT: 44 % (ref 39.0–52.0)
Hemoglobin: 15 g/dL (ref 13.0–17.0)
Potassium: 4.1 mmol/L (ref 3.5–5.1)
SODIUM: 139 mmol/L (ref 135–145)
TCO2: 29 mmol/L (ref 22–32)

## 2017-10-18 LAB — CBC
HCT: 45.7 % (ref 39.0–52.0)
Hemoglobin: 14.7 g/dL (ref 13.0–17.0)
MCH: 26.6 pg (ref 26.0–34.0)
MCHC: 32.2 g/dL (ref 30.0–36.0)
MCV: 82.6 fL (ref 78.0–100.0)
Platelets: 297 10*3/uL (ref 150–400)
RBC: 5.53 MIL/uL (ref 4.22–5.81)
RDW: 12.7 % (ref 11.5–15.5)
WBC: 8.3 10*3/uL (ref 4.0–10.5)

## 2017-10-18 MED ORDER — IOHEXOL 300 MG/ML  SOLN
100.0000 mL | Freq: Once | INTRAMUSCULAR | Status: AC | PRN
  Administered 2017-10-18: 100 mL via INTRAVENOUS

## 2017-10-18 MED ORDER — KETOROLAC TROMETHAMINE 30 MG/ML IJ SOLN
30.0000 mg | Freq: Once | INTRAMUSCULAR | Status: AC
  Administered 2017-10-19: 30 mg via INTRAVENOUS
  Filled 2017-10-18: qty 1

## 2017-10-18 MED ORDER — DEXAMETHASONE SODIUM PHOSPHATE 10 MG/ML IJ SOLN
10.0000 mg | Freq: Once | INTRAMUSCULAR | Status: AC
  Administered 2017-10-18: 10 mg via INTRAVENOUS
  Filled 2017-10-18: qty 1

## 2017-10-18 MED ORDER — PENICILLIN G BENZATHINE & PROC 1200000 UNIT/2ML IM SUSP
1.2000 10*6.[IU] | Freq: Once | INTRAMUSCULAR | Status: AC
  Administered 2017-10-19: 1.2 10*6.[IU] via INTRAMUSCULAR
  Filled 2017-10-18: qty 2

## 2017-10-18 MED ORDER — SODIUM CHLORIDE 0.9 % IV BOLUS
1000.0000 mL | Freq: Once | INTRAVENOUS | Status: AC
  Administered 2017-10-18: 1000 mL via INTRAVENOUS

## 2017-10-18 NOTE — ED Triage Notes (Signed)
Pt reports having left side sore throat/neck for several days. Feels like it is causing sob and difficulty swallowing. Airway intact at triage. Denies fever.

## 2017-10-18 NOTE — ED Provider Notes (Signed)
Patient placed in Quick Look pathway, seen and evaluated   Chief Complaint: swelling left side of neck  HPI:   Pt reports swelling left side of neck.   ROS: no fever, no chills   Physical Exam:   Gen: No distress  Neuro: Awake and Alert  Skin: Warm    Focused Exam: slight assmentry left neck   Initiation of care has begun. The patient has been counseled on the process, plan, and necessity for staying for the completion/evaluation, and the remainder of the medical screening examination   Osie CheeksSofia, Leslie K, PA-C 10/18/17 1847    Tegeler, Canary Brimhristopher J, MD 10/19/17 [phone]

## 2017-10-19 MED ORDER — IBUPROFEN 600 MG PO TABS: 600.0000 mg | ORAL_TABLET | Freq: Four times a day (QID) | ORAL | 0 refills | Status: DC | PRN

## 2017-10-19 NOTE — Discharge Instructions (Signed)
Thank you for allowing me to care for you today in the Emergency Department.   The swelling and pain in your throat should improve over the next few days as your infection improves.  For pain and swelling, take 600 mg of ibuprofen with food every 6-8 hours.  If your pain returns before that time, you can take 650 mg of Tylenol.  You can also alternate between these 2 medications every 3 hours.  You can take your first dose of ibuprofen tomorrow morning.  You should start to notice a significant improvement in your symptoms in the next 48 hours.  Return to the emergency department if you develop severe shortness of breath, feel as if your throat is closing, severe chest pain, high fever despite taking ibuprofen and Tylenol, or other new, concerning symptoms.

## 2017-10-19 NOTE — ED Provider Notes (Signed)
[facility] Provider Note   CSN: [id number] Arrival date & time: 10/18/17  1728     History   Chief Complaint Chief Complaint  Patient presents with  . Neck Pain  . Sore Throat    HPI Brad King is a 27 y.o. male with a history of migraines who presents to the emergency department with a chief complaint of left sided sore throat that began 1 week ago with left-sided swelling that began 3 days ago and has worsened since onset.  He reports that for the last 2 nights that he has felt as if his throat is closing up when he is laying flat.  He also endorses dysphagia.  Pain is worse with yawning and swallowing and alleviated by nothing.  He denies fever, chills, drooling, trismus, muffled voice, chest pain, or dyspnea.   No treatment prior to arrival.  No known sick contacts.  The history is provided by the patient. No language interpreter was used.    Past Medical History:  Diagnosis Date  . Migraines     There are no active problems to display for this patient.   History reviewed. No pertinent surgical history.      Home Medications    Prior to Admission medications   Medication Sig Start Date End Date Taking? Authorizing Provider  ibuprofen (ADVIL,MOTRIN) 600 MG tablet Take 1 tablet (600 mg total) by mouth every 6 (six) hours as needed. 10/19/17   Lydie Stammen, Coral ElseMia A, PA-C    Family History History reviewed. No pertinent family history.  Social History Social History   Tobacco Use  . Smoking status: Never Smoker  . Smokeless tobacco: Never Used  Substance Use Topics  . Alcohol use: No  . Drug use: No     Allergies   Patient has no known allergies.   Review of Systems Review of Systems  Constitutional: Negative for appetite change and fever.  HENT: Positive for facial swelling, sore throat and trouble swallowing. Negative for congestion, dental problem, drooling, rhinorrhea, sinus pain and voice change.   Eyes:  Negative for visual disturbance.  Respiratory: Negative for shortness of breath.   Cardiovascular: Negative for chest pain.  Gastrointestinal: Negative for abdominal pain.  Genitourinary: Negative for dysuria.  Musculoskeletal: Negative for back pain and neck stiffness.  Skin: Negative for rash.  Allergic/Immunologic: Negative for immunocompromised state.  Neurological: Negative for headaches.  Psychiatric/Behavioral: Negative for confusion.   Physical Exam Updated Vital Signs BP (!) 142/91 (BP Location: Left Arm)   Pulse (!) 56   Temp 98.6 F (37 C) (Oral)   Resp 14   Ht 5\' 10"  (1.778 m)   Wt 106.6 kg (235 lb)   SpO2 100%   BMI 33.72 kg/m   Physical Exam  Constitutional: He appears well-developed.  Non-toxic appearance. He does not appear ill. No distress.  HENT:  Head: Normocephalic.  Right Ear: Hearing, tympanic membrane and ear canal normal.  Left Ear: Hearing, tympanic membrane and ear canal normal.  Mouth/Throat: Uvula is midline. No oral lesions. No uvula swelling. Posterior oropharyngeal erythema present. No oropharyngeal exudate or posterior oropharyngeal edema. Tonsils are 1+ on the right. Tonsils are 1+ on the left. No tonsillar exudate.  No woody induration or tenderness to the sublingual region  Eyes: Pupils are equal, round, and reactive to light. Conjunctivae and EOM are normal.  Neck: Normal range of motion. Neck supple. No thyromegaly present.  Swelling noted to the left side of the neck.  Diffuse  anterior cervical lymphadenopathy, left greater than right.  Trachea is midline.  Cardiovascular: Normal rate, regular rhythm, normal heart sounds and intact distal pulses. Exam reveals no gallop and no friction rub.  No murmur heard. Pulmonary/Chest: Effort normal. No stridor. No respiratory distress. He has no wheezes. He has no rales. He exhibits no tenderness.  Abdominal: Soft. He exhibits no distension.  Lymphadenopathy:    He has cervical adenopathy.    Neurological: He is alert.  Skin: Skin is warm and dry.  Psychiatric: His behavior is normal.  Nursing note and vitals reviewed.    ED Treatments / Results  Labs (all labs ordered are listed, but only abnormal results are displayed) Labs Reviewed  CBC  I-STAT CHEM 8, ED    EKG None  Radiology Dg Neck Soft Tissue  Result Date: 10/18/2017 CLINICAL DATA:  27 year old with shortness of breath and neck swelling. EXAM: NECK SOFT TISSUES - 1+ VIEW COMPARISON:  None. FINDINGS: There is a kyphosis in the cervical spine. The prevertebral soft tissues are prominent measuring up to 1.2 cm in thickness. Normal appearance of the epiglottis. Upper lungs are clear. IMPRESSION: Prominent prevertebral soft tissues. Findings raise concern for retropharyngeal swelling. Consider further evaluation with a neck CT with IV contrast. Electronically Signed   By: Richarda Overlie M.D.   On: 10/18/2017 19:16   Ct Soft Tissue Neck W Contrast  Result Date: 10/18/2017 CLINICAL DATA:  Sore throat and stridor EXAM: CT NECK WITH CONTRAST TECHNIQUE: Multidetector CT imaging of the neck was performed using the standard protocol following the bolus administration of intravenous contrast. CONTRAST:  OMNIPAQUE IOHEXOL 300 MG/ML  SOLN COMPARISON:  None. FINDINGS: PHARYNX AND LARYNX: --Nasopharynx: Fossae of Rosenmuller are clear. Normal adenoid tonsils for age. --Oral cavity and oropharynx: The palatine and lingual tonsils are normal. The visible oral cavity and floor of mouth are normal. --Hypopharynx: Normal vallecula and pyriform sinuses. --Larynx: Normal epiglottis and pre-epiglottic space. Normal aryepiglottic and vocal folds. --Retropharyngeal space: There is a retropharyngeal effusion measuring up to 7 mm in thickness extending from the oropharynx to the level of the thyroid. There are enlarged retropharyngeal lymph nodes that measure up to 2.3 cm on the left. SALIVARY GLANDS: --Parotid: No mass lesion or inflammation. No  sialolithiasis or ductal dilatation. --Submandibular: Symmetric without inflammation. No sialolithiasis or ductal dilatation. --Sublingual: Normal. No ranula or other visible lesion of the base of tongue and floor of mouth. THYROID: Normal. LYMPH NODES: As above, enlarged left retropharyngeal node measuring up to 2.3 cm. Bilateral level 2A nodes measure up to 10 mm. VASCULAR: Major cervical vessels are patent. LIMITED INTRACRANIAL: Normal. VISUALIZED ORBITS: Normal. MASTOIDS AND VISUALIZED PARANASAL SINUSES: Small polyps within both maxillary sinuses. SKELETON: No bony spinal canal stenosis. No lytic or blastic lesions. UPPER CHEST: Clear. OTHER: None. IMPRESSION: 1. Retropharyngeal edema/effusion with enlarged, reactive retropharyngeal and level 2A cervical nodes. 2. No airway compromise. 3. Normal tonsils. Electronically Signed   By: Deatra Robinson M.D.   On: 10/18/2017 23:03    Procedures Procedures (including critical care time)  Medications Ordered in ED Medications  iohexol (OMNIPAQUE) 300 MG/ML solution 100 mL (100 mLs Intravenous Contrast Given 10/18/17 2215)  sodium chloride 0.9 % bolus 1,000 mL (0 mLs Intravenous Stopped 10/19/17 0031)  dexamethasone (DECADRON) injection 10 mg (10 mg Intravenous Given 10/18/17 2302)  ketorolac (TORADOL) 30 MG/ML injection 30 mg (30 mg Intravenous Given 10/19/17 0026)  penicillin g procaine-penicillin g benzathine (BICILLIN-CR) injection 600000-600000 units (1.2 Million Units Intramuscular Given 10/19/17  1914)     Initial Impression / Assessment and Plan / ED Course  I have reviewed the triage vital signs and the nursing notes.  Pertinent labs & imaging results that were available during my care of the patient were reviewed by me and considered in my medical decision making (see chart for details).     27 year old male with a history of migraines presenting with left-sided sore throat for the last week with left-sided neck swelling for the last 3 days.  He  states that he has felt as if his throat is closing when he is laid down to sleep for the last 2 nights.  X-ray of the soft tissues of the neck ordered by triage demonstrating prominent prevertebral soft tissues that is concerning for retropharyngeal swelling.   Labs are normal.  CT of the soft tissues of the neck demonstrating retropharyngeal edema with enlarged reactive retropharyngeal and cervical lymph nodes.  No airway compromise.  Tonsils are normal.  IV fluid bolus and Decadron given in the ED.  Strep PCR was not previously ordered, but will cover the patient with penicillin G in the ED.  He was given Toradol for pain control.  He has been able to swallow fluids and has been speaking without respiratory distress.  Doubt Ludwig's angina or deep space infection.  Will discharge the patient home with anti-inflammatories.  He is hemodynamically stable and in no acute distress.  He is safe for discharge home with outpatient follow-up at this time.  Final Clinical Impressions(s) / ED Diagnoses   Final diagnoses:  Pharyngitis, unspecified etiology    ED Discharge Orders        Ordered    ibuprofen (ADVIL,MOTRIN) 600 MG tablet  Every 6 hours PRN     10/19/17 0030       Ladarius Seubert A, PA-C 10/19/17 7829    Derwood Kaplan, MD 10/19/17 5621

## 2018-03-06 ENCOUNTER — Emergency Department (HOSPITAL_COMMUNITY)
Admission: EM | Admit: 2018-03-06 | Discharge: 2018-03-06 | Disposition: A | Discharge status: Home / Self Care | Payer: Self-pay | Attending: Emergency Medicine | Admitting: Emergency Medicine

## 2018-03-06 ENCOUNTER — Encounter (HOSPITAL_COMMUNITY): Payer: Self-pay | Admitting: *Deleted

## 2018-03-06 DIAGNOSIS — R599 Enlarged lymph nodes, unspecified: Secondary | ICD-10-CM | POA: Insufficient documentation

## 2018-03-06 MED ORDER — DEXAMETHASONE SODIUM PHOSPHATE 10 MG/ML IJ SOLN
10.0000 mg | Freq: Once | INTRAMUSCULAR | Status: AC
  Administered 2018-03-06: 10 mg via INTRAMUSCULAR
  Filled 2018-03-06: qty 1

## 2018-03-06 MED ORDER — IBUPROFEN 600 MG PO TABS: 600.0000 mg | ORAL_TABLET | Freq: Four times a day (QID) | ORAL | 0 refills | Status: DC | PRN

## 2018-03-06 NOTE — Discharge Instructions (Addendum)
Take ibuprofen 4 times a day with meals as needed for pain or swelling.  Do not take other anti-inflammatories at the same time (Advil, Motrin, naproxen, Aleve). You may supplement with Tylenol if you need further pain control. Follow-up with ear nose and throat doctor as needed for further evaluation of your symptoms. Return to the emergency room if you develop high fevers, difficulty breathing, inability to swallow, or any new or worsening symptoms.

## 2018-03-06 NOTE — ED Provider Notes (Signed)
[facility] Provider Note   CSN: [id number] Arrival date & time: 03/06/18  1043     History   Chief Complaint Chief Complaint  Patient presents with  . Sore Throat    HPI Brad King is a 27 y.o. male ending for evaluation of neck pain and swelling.  Patient states for the past week, he has had gradually increasing left-sided neck pain and swelling.  He states it feels similar to when he was seen in July.  At this time, he was treated with steroids and other anti-inflammatories.  There is no known cause at that time, but he did have a CT.  He does not know what it said.  He denies recent fevers, chills, ear pain, nasal congestion, sore throat, cough, chest pain, shortness of breath, nausea, vomiting, dental pain, urinary symptoms, normal bowel movements.  He is able to eat and drink.  He states the swelling feels worse than last time, and feels like it is pressing on his throat.  He has no medical problems, takes no medications daily.  He has not taken anything for his symptoms so far this week.  Prior to this summer, he denies similar events.   Additional history obtained from chart review, patient seen in July for similar episode of neck pain and swelling.  At that time, he had CT scan which showed retropharyngeal edema/inflammation, and reactive lymph nodes.  Patient was given Decadron and discharged on NSAIDs.  Per patient, symptoms completely resolved with this treatment until a week ago.  HPI  Past Medical History:  Diagnosis Date  . Migraines     There are no active problems to display for this patient.   History reviewed. No pertinent surgical history.      Home Medications    Prior to Admission medications   Medication Sig Start Date End Date Taking? Authorizing Provider  ibuprofen (ADVIL,MOTRIN) 600 MG tablet Take 1 tablet (600 mg total) by mouth every 6 (six) hours as needed. 03/06/18   Makira Holleman, PA-C    Family  History History reviewed. No pertinent family history.  Social History Social History   Tobacco Use  . Smoking status: Never Smoker  . Smokeless tobacco: Never Used  Substance Use Topics  . Alcohol use: No  . Drug use: No     Allergies   Patient has no known allergies.   Review of Systems Review of Systems  HENT:       Left-sided neck pain and swelling  All other systems reviewed and are negative.    Physical Exam Updated Vital Signs BP (!) 149/103 (BP Location: Left Arm)   Pulse 63   Temp 98.6 F (37 C) (Oral)   Resp 16   SpO2 99%   Physical Exam  Constitutional: He is oriented to person, place, and time. He appears well-developed and well-nourished. No distress.  Sitting comfortably in the bed in no acute distress  HENT:  Head: Normocephalic and atraumatic.  Right Ear: Tympanic membrane, external ear and ear canal normal.  Left Ear: Tympanic membrane, external ear and ear canal normal.  Nose: Nose normal.  Mouth/Throat: Uvula is midline, oropharynx is clear and moist and mucous membranes are normal.  Airway intact.  Handling secretions easily.  OP clear without tonsillar swelling or exudate.  Uvula midline with palate rise.  TMs nonerythematous nonbulging bilaterally.  Eyes: Pupils are equal, round, and reactive to light. Conjunctivae and EOM are normal.  Neck: Normal range of motion.  Neck supple. No tracheal deviation present.  Tenderness and swelling of the left side neck soft tissue.  No tenderness over the trachea.  No obvious tracheal deviation.  Full active range of motion of the neck without pain  Cardiovascular: Normal rate, regular rhythm and intact distal pulses.  Pulmonary/Chest: Effort normal and breath sounds normal. No respiratory distress. He has no wheezes.  Speaking in full sentences.  Clear lung sounds in all fields  Abdominal: Soft. He exhibits no distension. There is no tenderness.  Musculoskeletal: Normal range of motion.  Neurological: He  is alert and oriented to person, place, and time.  Skin: Skin is warm and dry. Capillary refill takes less than 2 seconds.  Psychiatric: He has a normal mood and affect.  Nursing note and vitals reviewed.    ED Treatments / Results  Labs (all labs ordered are listed, but only abnormal results are displayed) Labs Reviewed - No data to display  EKG None  Radiology No results found.  Procedures Procedures (including critical care time)  Medications Ordered in ED Medications  dexamethasone (DECADRON) injection 10 mg (10 mg Intramuscular Given 03/06/18 1144)     Initial Impression / Assessment and Plan / ED Course  I have reviewed the triage vital signs and the nursing notes.  Pertinent labs & imaging results that were available during my care of the patient were reviewed by me and considered in my medical decision making (see chart for details).     Patient presenting for evaluation of neck pain and swelling, which presents similarly as a previous visit several months ago.  Patient without obvious infectious symptoms, no fevers, chills, cough, sore throat, or signs of infection.  Patient does have swelling and tenderness of the left side neck.  CT from previous visit reviewed, at that time patient had inflammation without obvious source.  Was treated well with anti-inflammatories.  Likely repeated the same.  Case discussed with attending, Dr. Jeraldine Loots agrees to plan.  Patient was without sign of airway compromise, and in no distress at this time.  Tolerating p.o. and breathing easily.  I do not believe repeat CT scan is necessary.  Decadron given today, patient discharged on NSAIDs.  Follow-up with ENT as needed if symptoms persist or return.  At this time, patient appears safe for discharge.  Return precautions given.  Patient states he understands and agrees to plan.   Final Clinical Impressions(s) / ED Diagnoses   Final diagnoses:  Reactive lymphadenopathy    ED Discharge  Orders         Ordered    ibuprofen (ADVIL,MOTRIN) 600 MG tablet  Every 6 hours PRN     03/06/18 1201           Parish Augustine, PA-C 03/06/18 1422    Gerhard Munch, MD 03/07/18 2020

## 2018-03-06 NOTE — ED Triage Notes (Signed)
Pt in c/o sore throat and swelling to the left side of his neck that started a week ago, worse with swallowing, no distress noted

## 2018-03-20 ENCOUNTER — Encounter (HOSPITAL_COMMUNITY): Payer: Self-pay | Admitting: *Deleted

## 2018-03-20 ENCOUNTER — Other Ambulatory Visit: Payer: Self-pay

## 2018-03-20 ENCOUNTER — Emergency Department (HOSPITAL_COMMUNITY): Payer: Self-pay

## 2018-03-20 ENCOUNTER — Emergency Department (HOSPITAL_COMMUNITY)
Admission: EM | Admit: 2018-03-20 | Discharge: 2018-03-21 | Disposition: A | Discharge status: Home / Self Care | Payer: Self-pay | Attending: Emergency Medicine | Admitting: Emergency Medicine

## 2018-03-20 DIAGNOSIS — L089 Local infection of the skin and subcutaneous tissue, unspecified: Secondary | ICD-10-CM | POA: Insufficient documentation

## 2018-03-20 LAB — CBC WITH DIFFERENTIAL/PLATELET
ABS IMMATURE GRANULOCYTES: 0.03 10*3/uL (ref 0.00–0.07)
Basophils Absolute: 0 10*3/uL (ref 0.0–0.1)
Basophils Relative: 0 %
Eosinophils Absolute: 0.3 10*3/uL (ref 0.0–0.5)
Eosinophils Relative: 3 %
HCT: 43.1 % (ref 39.0–52.0)
Hemoglobin: 13.4 g/dL (ref 13.0–17.0)
Immature Granulocytes: 0 %
Lymphocytes Relative: 26 %
Lymphs Abs: 2 10*3/uL (ref 0.7–4.0)
MCH: 25.6 pg — ABNORMAL LOW (ref 26.0–34.0)
MCHC: 31.1 g/dL (ref 30.0–36.0)
MCV: 82.4 fL (ref 80.0–100.0)
Monocytes Absolute: 0.7 10*3/uL (ref 0.1–1.0)
Monocytes Relative: 9 %
NEUTROS ABS: 4.7 10*3/uL (ref 1.7–7.7)
Neutrophils Relative %: 62 %
PLATELETS: 305 10*3/uL (ref 150–400)
RBC: 5.23 MIL/uL (ref 4.22–5.81)
RDW: 12.6 % (ref 11.5–15.5)
WBC: 7.7 10*3/uL (ref 4.0–10.5)
nRBC: 0 % (ref 0.0–0.2)

## 2018-03-20 LAB — I-STAT CHEM 8, ED
BUN: 10 mg/dL (ref 6–20)
CREATININE: 1 mg/dL (ref 0.61–1.24)
Calcium, Ion: 1.15 mmol/L (ref 1.15–1.40)
Chloride: 101 mmol/L (ref 98–111)
Glucose, Bld: 93 mg/dL (ref 70–99)
HCT: 42 % (ref 39.0–52.0)
Hemoglobin: 14.3 g/dL (ref 13.0–17.0)
POTASSIUM: 4.3 mmol/L (ref 3.5–5.1)
Sodium: 138 mmol/L (ref 135–145)
TCO2: 31 mmol/L (ref 22–32)

## 2018-03-20 LAB — MONONUCLEOSIS SCREEN: Mono Screen: NEGATIVE

## 2018-03-20 MED ORDER — IOHEXOL 300 MG/ML  SOLN
75.0000 mL | Freq: Once | INTRAMUSCULAR | Status: AC | PRN
  Administered 2018-03-20: 75 mL via INTRAVENOUS

## 2018-03-20 MED ORDER — HYDROCODONE-ACETAMINOPHEN 7.5-325 MG/15ML PO SOLN
15.0000 mL | Freq: Once | ORAL | Status: AC
  Administered 2018-03-20: 15 mL via ORAL

## 2018-03-20 MED ORDER — HYDROCODONE-ACETAMINOPHEN 7.5-325 MG/15ML PO SOLN
10.0000 mL | Freq: Once | ORAL | Status: DC
  Filled 2018-03-20: qty 15

## 2018-03-20 NOTE — ED Triage Notes (Signed)
The pt has had a sore throat for 2 weeks  He was seen here and treated   And he still has the same thing  He was told it was a virus

## 2018-03-20 NOTE — ED Notes (Signed)
Patient transported to CT 

## 2018-03-20 NOTE — ED Provider Notes (Addendum)
[facility] Provider [facility]   CSN: [id number] Arrival date & time: 03/20/18  1829     History   Chief Complaint Chief Complaint  Patient presents with  . Sore Throat    HPI Brad King is a 27 y.o. male.  The history is provided by the patient and medical records.     27 y.o. M with hx of migraine headaches here with sore throat and left sided neck swelling.  States he was seen for similar in July of this year, given nonspecific diagnosis, shot of steroids, and sent home.  Symptoms returned about a week and half ago.  He was seen in the ED and given steroids once again.  He returns due to worsening swelling along the left side of his neck.  States he continues to have sore throat with difficulty swallowing and now to the point he is having trouble turning his head.  States he has not been able to sleep.  He denies any difficulty breathing.  He has not really been able to eat or drink very much.  He has been taking Tylenol with minimal relief of pain.  He was given ENT follow-up at last ED visit, however states he does not have insurance and cannot afford to pay out of pocket.    Past Medical History:  Diagnosis Date  . Migraines     There are no active problems to display for this patient.   History reviewed. No pertinent surgical history.      Home Medications    Prior to Admission medications   Medication Sig Start Date End Date Taking? Authorizing Provider  ibuprofen (ADVIL,MOTRIN) 600 MG tablet Take 1 tablet (600 mg total) by mouth every 6 (six) hours as needed. 03/06/18   Caccavale, Sophia, PA-C    Family History No family history on file.  Social History Social History   Tobacco Use  . Smoking status: Never Smoker  . Smokeless tobacco: Never Used  Substance Use Topics  . Alcohol use: No  . Drug use: No     Allergies   Patient has no known allergies.   Review of Systems Review of Systems  HENT: Positive for  sore throat and trouble swallowing.   All other systems reviewed and are negative.    Physical Exam Updated Vital Signs BP (!) 144/95 (BP Location: Right Arm)   Pulse 72   Temp 98.4 F (36.9 C) (Oral)   Resp 17   Ht 5\' 11"  (1.803 m)   Wt 108.9 kg   SpO2 99%   BMI 33.47 kg/m   Physical Exam Vitals signs and nursing note reviewed.  Constitutional:      Appearance: He is well-developed.  HENT:     Head: Normocephalic and atraumatic.     Right Ear: Tympanic membrane and ear canal normal.     Left Ear: Tympanic membrane and ear canal normal.     Ears:     Comments: Mastoids non-tender, no swelling    Nose: Nose normal.  Eyes:     Conjunctiva/sclera: Conjunctivae normal.     Pupils: Pupils are equal, round, and reactive to light.  Neck:     Musculoskeletal: Normal range of motion. Normal range of motion. No erythema, neck rigidity or torticollis.     Comments: Enlarged lymph nodes along left side of neck, swelling noted surrounding and down lateral aspect of neck; tonsils 1+ bilaterally without exudates; uvula midline without evidence of peritonsillar abscess; handling secretions appropriately;  no difficulty swallowing or speaking; normal phonation without stridor Cardiovascular:     Rate and Rhythm: Normal rate and regular rhythm.     Heart sounds: Normal heart sounds.  Pulmonary:     Effort: Pulmonary effort is normal.     Breath sounds: Normal breath sounds.  Abdominal:     General: Bowel sounds are normal.     Palpations: Abdomen is soft.  Musculoskeletal: Normal range of motion.  Lymphadenopathy:     Cervical: Cervical adenopathy present.     Left cervical: Superficial cervical adenopathy and posterior cervical adenopathy present.  Skin:    General: Skin is warm and dry.  Neurological:     Mental Status: He is alert and oriented to person, place, and time.      ED Treatments / Results  Labs (all labs ordered are listed, but only abnormal results are  displayed) Labs Reviewed  CBC WITH DIFFERENTIAL/PLATELET - Abnormal; Notable for the following components:      Result Value   MCH 25.6 (*)    All other components within normal limits  GROUP A STREP BY PCR  MONONUCLEOSIS SCREEN  I-STAT CHEM 8, ED    EKG None  Radiology Ct Soft Tissue Neck W Contrast  Result Date: 03/21/2018 CLINICAL DATA:  Sore throat for 2 weeks. LEFT neck pain and swelling for 1 week. EXAM: CT NECK WITH CONTRAST TECHNIQUE: Multidetector CT imaging of the neck was performed using the standard protocol following the bolus administration of intravenous contrast. CONTRAST:  75mL OMNIPAQUE IOHEXOL 300 MG/ML  SOLN COMPARISON:  CT neck October 19, 2007 FINDINGS: PHARYNX AND LARYNX: Ill-defined enhancing 2.3 x 1.8 cm mass LEFT piriform sinus to supraglottic fat and its LEFT carotid space mass. Small retropharyngeal effusion. Normal larynx. Patent airway. SALIVARY GLANDS: Normal. THYROID: Normal. LYMPH NODES: Mild cervical reactive lymphadenopathy. VASCULAR: Normal. LIMITED INTRACRANIAL: Normal. VISUALIZED ORBITS: Normal. MASTOIDS AND VISUALIZED PARANASAL SINUSES: Mild-to-moderate paranasal sinusitis without air-fluid levels. Mastoid air cells are well aerated. SKELETON: Enlarged LEFT distal sternocleidomastoid muscle with superimposed 1.6 by 2.4 x 2.3 cm (transverse by AP by cc) enhancing complex mass (series 9, image 59). Mildly enlarged edematous LEFT strap muscles. Thickened LEFT platysma. RIGHT maxillary molar periapical abscess and dental carie. Mild RIGHT upper cervical facet arthropathy. UPPER CHEST: Lung apices are clear. No superior mediastinal lymphadenopathy. OTHER: None. IMPRESSION: 1. Enhancing 2.3 x 1.8 cm mass versus phlegmon abutting the piriform sinus. Associated LEFT neck transspatial edema. LEFT sternocleidomastoid mastoid myositis and 1.6 x 2.4 x 2.3 cm early abscess within or contiguous with the LEFT SCM. 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 2. Patent airway.  Mild cervical lymphadenopathy. 3. Recommend ENT consultation. 4. Acute findings discussed with and reconfirmed by Sharilyn SitesLISA Ozzie Knobel on 03/20/2018 at 12:13 am. Electronically Signed   By: Awilda Metroourtnay  Bloomer M.D.   On: 03/21/2018 00:14    Procedures Procedures (including critical care time)  Medications Ordered in ED Medications  HYDROcodone-acetaminophen (HYCET) 7.5-325 mg/15 ml solution 15 mL (15 mLs Oral Given 03/20/18 2239)  iohexol (OMNIPAQUE) 300 MG/ML solution 75 mL (75 mLs Intravenous Contrast Given 03/20/18 2331)     Initial Impression / Assessment and Plan / ED Course  I have reviewed the triage vital signs and the nursing notes.  Pertinent labs & imaging results that were available during my care of the patient were reviewed by me and considered in my medical decision making (see chart for details).  27 year old male here with left-sided  neck swelling.  He reports issues with this over the past few months.  He was seen in the ED a week and half ago and given steroids but reports swelling has nearly "doubled" in size since that time.  He reports a lot of pain with swallowing and trouble eating/drinking as well as sleeping.  He is afebrile and nontoxic.  Does have some lymphadenopathy along the left side of his neck but has visible swelling along the lateral neck.   I do not appreciate any discrete mass.  He does not have any significant tonsillar edema or exudates.  He is handling secretions well, normal phonation without stridor.   Plan for screening labs, Monospot, rapid strep as well as CT soft tissue.  Labs are overall reassuring.  Monospot is negative.  CT with findings of left SCM myositis and early abscess formation.  Concern for brachial cleft cyst with sinus tract.  Airway is patent.  Lymphadenopathy present which is likely reactive.  Will discuss with ENT.  CT results discussed with ENT, Dr. Annalee Genta-- without other infectious symptoms and  reassuring labs, recommended home with PO abx and office follow-up on Thursday after Christmas holiday.  Patient comfortable with care plan.  Discharged home with Augmentin, Vicodin for pain.  Encourage warm compresses to the neck.  He will return here for any new or worsening symptoms, specifically any difficulty swallowing, worsening swelling, high fevers, etc.  Final Clinical Impressions(s) / ED Diagnoses   Final diagnoses:  Neck infection    ED Discharge Orders         Ordered    amoxicillin-clavulanate (AUGMENTIN) 875-125 MG tablet  Every 12 hours     03/21/18 0046    HYDROcodone-acetaminophen (NORCO/VICODIN) 5-325 MG tablet  Every 4 hours PRN     03/21/18 0046           Garlon Hatchet, PA-C 03/21/18 0053    Garlon Hatchet, PA-C 03/21/18 1324    Jacalyn Lefevre, MD 03/23/18 1620

## 2018-03-21 LAB — GROUP A STREP BY PCR: Group A Strep by PCR: NOT DETECTED

## 2018-03-21 MED ORDER — AMOXICILLIN-POT CLAVULANATE 875-125 MG PO TABS: 1.0000 | ORAL_TABLET | Freq: Two times a day (BID) | ORAL | 0 refills | Status: DC

## 2018-03-21 MED ORDER — HYDROCODONE-ACETAMINOPHEN 5-325 MG PO TABS: 1.0000 | ORAL_TABLET | ORAL | 0 refills | Status: DC | PRN

## 2018-03-21 NOTE — Discharge Instructions (Signed)
Take the prescribed medication as directed.  Can use warm compresses to the area as well. Follow-up with Dr. Annalee GentaShoemaker on Thursday-- call his office first thing in the morning to schedule appt. Return to the ED for new or worsening symptoms.

## 2018-09-04 ENCOUNTER — Emergency Department (HOSPITAL_COMMUNITY): Payer: Self-pay

## 2018-09-04 ENCOUNTER — Emergency Department (HOSPITAL_COMMUNITY)
Admission: EM | Admit: 2018-09-04 | Discharge: 2018-09-04 | Disposition: A | Discharge status: Home / Self Care | Payer: Self-pay | Attending: Emergency Medicine | Admitting: Emergency Medicine

## 2018-09-04 ENCOUNTER — Encounter (HOSPITAL_COMMUNITY): Payer: Self-pay | Admitting: Emergency Medicine

## 2018-09-04 DIAGNOSIS — M542 Cervicalgia: Secondary | ICD-10-CM | POA: Insufficient documentation

## 2018-09-04 DIAGNOSIS — Q180 Sinus, fistula and cyst of branchial cleft: Secondary | ICD-10-CM | POA: Insufficient documentation

## 2018-09-04 LAB — CBC WITH DIFFERENTIAL/PLATELET
Abs Immature Granulocytes: 0.01 10*3/uL (ref 0.00–0.07)
Basophils Absolute: 0 10*3/uL (ref 0.0–0.1)
Basophils Relative: 0 %
Eosinophils Absolute: 0.2 10*3/uL (ref 0.0–0.5)
Eosinophils Relative: 3 %
HCT: 45 % (ref 39.0–52.0)
Hemoglobin: 14.8 g/dL (ref 13.0–17.0)
Immature Granulocytes: 0 %
Lymphocytes Relative: 30 %
Lymphs Abs: 2 10*3/uL (ref 0.7–4.0)
MCH: 26.1 pg (ref 26.0–34.0)
MCHC: 32.9 g/dL (ref 30.0–36.0)
MCV: 79.5 fL — ABNORMAL LOW (ref 80.0–100.0)
Monocytes Absolute: 0.6 10*3/uL (ref 0.1–1.0)
Monocytes Relative: 9 %
Neutro Abs: 3.8 10*3/uL (ref 1.7–7.7)
Neutrophils Relative %: 58 %
Platelets: 251 10*3/uL (ref 150–400)
RBC: 5.66 MIL/uL (ref 4.22–5.81)
RDW: 13 % (ref 11.5–15.5)
WBC: 6.7 10*3/uL (ref 4.0–10.5)
nRBC: 0 % (ref 0.0–0.2)

## 2018-09-04 LAB — BASIC METABOLIC PANEL
Anion gap: 9 (ref 5–15)
BUN: 11 mg/dL (ref 6–20)
CO2: 24 mmol/L (ref 22–32)
Calcium: 9.2 mg/dL (ref 8.9–10.3)
Chloride: 106 mmol/L (ref 98–111)
Creatinine, Ser: 0.94 mg/dL (ref 0.61–1.24)
GFR calc Af Amer: >60 mL/min (ref >60)
GFR calc non Af Amer: >60 mL/min (ref >60)
Glucose, Bld: 105 mg/dL — ABNORMAL HIGH (ref 70–99)
Potassium: 4.2 mmol/L (ref 3.5–5.1)
Sodium: 139 mmol/L (ref 135–145)

## 2018-09-04 MED ORDER — HYDROCODONE-ACETAMINOPHEN 5-325 MG PO TABS
1.0000 | ORAL_TABLET | Freq: Once | ORAL | Status: AC
  Administered 2018-09-04: 1 via ORAL
  Filled 2018-09-04: qty 1

## 2018-09-04 MED ORDER — IOHEXOL 300 MG/ML  SOLN
75.0000 mL | Freq: Once | INTRAMUSCULAR | Status: AC | PRN
  Administered 2018-09-04: 75 mL via INTRAVENOUS

## 2018-09-04 MED ORDER — DEXAMETHASONE SODIUM PHOSPHATE 10 MG/ML IJ SOLN
10.0000 mg | Freq: Once | INTRAMUSCULAR | Status: AC
  Administered 2018-09-04: 15:00:00 10 mg via INTRAVENOUS
  Filled 2018-09-04: qty 1

## 2018-09-04 MED ORDER — AMOXICILLIN-POT CLAVULANATE 875-125 MG PO TABS: 1.0000 | ORAL_TABLET | Freq: Two times a day (BID) | ORAL | 0 refills | Status: AC

## 2018-09-04 NOTE — ED Triage Notes (Signed)
Pt. Stated, Ive had neck swelling for the last month. It comes and goes. It started about a year ago ordinally.Brad King been here before for the same thing. I was suppose to go to a specialist and I couldn't afford it.

## 2018-09-04 NOTE — ED Notes (Signed)
AVS given, prescription provided.  No further questions at this time

## 2018-09-04 NOTE — ED Notes (Signed)
Patient transported to CT 

## 2018-09-04 NOTE — ED Notes (Signed)
Patient has swelling in his left knee, reports 20 dr visits, MRI, and has not been able to eat .  Reports losing 35 lbs in last month.

## 2018-09-04 NOTE — ED Provider Notes (Signed)
[facility] Provider Note   CSN: [id number] Arrival date & time: 09/04/18  1228    History   Chief Complaint Chief Complaint  Patient presents with   Torticollis    swelling    HPI Brad King is a 28 y.o. male resenting for evaluation of neck pain and swelling.  Patient states the past 4 weeks, he has had swelling without pain of the left side of his neck.  Of the past several days, he has developed increased pain.  Pain is worse when he moves his head and when he swallows.  No increased pain with movement of his jaw.  She has been seen multiple times for the same, states his symptoms always improve after prednisone and antibiotics.  He was unable to follow-up with ENT after his last visit due to financial constraints.  He denies fevers, chills, sore throat, cough, shortness of breath.  He has no other medical problems, takes no medications daily.  Additional history obtained from chart review. Pt's last ct showed: "enhancing 2.3 x 1.8 cm mass versus phlegmon abutting the piriform sinus.  Associated left neck trans-fascial edema.  Left SCM mastoid myositis and 1.6 x 2.4 x 2.3 cm early abscess within or contiguous with the left SCM.  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  Mild cervical lymphadenopathy." Pt has been seen 4 times in the past year for the same neck swelling.     HPI  Past Medical History:  Diagnosis Date   Migraines     There are no active problems to display for this patient.   History reviewed. No pertinent surgical history.      Home Medications    Prior to Admission medications   Medication Sig Start Date End Date Taking? Authorizing Provider  acetaminophen (TYLENOL) 325 MG tablet Take 650 mg by mouth every 6 (six) hours as needed for mild pain.   Yes [provider]  amoxicillin-clavulanate (AUGMENTIN) 875-125 MG tablet Take 1 tablet by mouth every 12 (twelve)  hours for 10 days. 09/04/18 09/14/18  Antawan Mchugh, PA-C    Family History No family history on file.  Social History Social History   Tobacco Use   Smoking status: Never Smoker   Smokeless tobacco: Never Used  Substance Use Topics   Alcohol use: No   Drug use: No     Allergies   Patient has no known allergies.   Review of Systems Review of Systems  HENT:       L anterolateral neck swelling and tenderness.   All other systems reviewed and are negative.    Physical Exam Updated Vital Signs BP 138/72    Pulse 78    Temp 98.6 F (37 C) (Oral)    Resp 18    Ht 5\' 11"  (1.803 m)    Wt 106.6 kg    SpO2 99%    BMI 32.78 kg/m   Physical Exam Vitals signs and nursing note reviewed.  Constitutional:      General: He is not in acute distress.    Appearance: He is well-developed.     Comments: Appears nontoxic  HENT:     Head: Normocephalic and atraumatic.     Comments: OP clear without tonsillar swelling exudate.  Uvula midline with equal palate rise.  Airway intact.  No trismus. Eyes:     Conjunctiva/sclera: Conjunctivae normal.     Pupils: Pupils are equal, round, and reactive to light.  Neck:     Musculoskeletal: Normal range of motion and neck supple.      Comments: Anterolateral swelling of the L side neck with tenderness. Pain with movement of hte nead. No ttp of the posterior neck or signs of meningismus.  Cardiovascular:     Rate and Rhythm: Normal rate and regular rhythm.  Pulmonary:     Effort: Pulmonary effort is normal. No respiratory distress.     Breath sounds: Normal breath sounds. No wheezing.  Abdominal:     General: There is no distension.     Palpations: Abdomen is soft. There is no mass.     Tenderness: There is no abdominal tenderness.  Musculoskeletal: Normal range of motion.  Skin:    General: Skin is warm and dry.     Capillary Refill: Capillary refill takes less than 2 seconds.  Neurological:     Mental Status: He is alert and  oriented to person, place, and time.      ED Treatments / Results  Labs (all labs ordered are listed, but only abnormal results are displayed) Labs Reviewed  CBC WITH DIFFERENTIAL/PLATELET - Abnormal; Notable for the following components:      Result Value   MCV 79.5 (*)    All other components within normal limits  BASIC METABOLIC PANEL - Abnormal; Notable for the following components:   Glucose, Bld 105 (*)    All other components within normal limits    EKG None  Radiology Ct Soft Tissue Neck W Contrast  Result Date: 09/04/2018 CLINICAL DATA:  Intermittent swelling of the left neck over the last month. No fever. EXAM: CT NECK WITH CONTRAST TECHNIQUE: Multidetector CT imaging of the neck was performed using the standard protocol following the bolus administration of intravenous contrast. CONTRAST:  96mL OMNIPAQUE IOHEXOL 300 MG/ML  SOLN COMPARISON:  03/20/2018.  10/18/2017. FINDINGS: Pharynx and larynx: No evidence of mucosal lesion. Effacement of the piriform sinus on the left due to submucosal/deep space disease in this location. There is soft tissue swelling and some enhancement at this level. Extending inferior to that, there are inflammatory changes of the left neck with loss of the fat planes separating the sternocleidomastoid muscle and strap musculature. There is some hyperenhancing component in this region as well. There is no evidence of discrete low-density cyst. Process remains isolated anterior to the carotid space. As noted previously, findings are most suggestive of a fourth branchial cleft cyst with possible fistula and left neck information. The location is not typical for thyroglossal duct cyst. There is no evidence of enlarging discrete mass lesion. There are no asymmetric or abnormal nodes visible, surprisingly. Salivary glands: Parotid and submandibular glands are intrinsically normal. The left submandibular gland is adjacent to the abnormal process along its posterior  margin. Thyroid: Thyroid gland appears normally positioned and normal in size, with symmetric appearance right to left. Lymph nodes: No abnormal nodes as noted above. Vascular: No primary vascular pathology. Limited intracranial: Normal Visualized orbits: Inferior aspects appear normal. Mastoids and visualized paranasal sinuses: Clear Skeleton: Normal Upper chest: Normal Other: None IMPRESSION: Redemonstration of an inflammatory process in the deep left neck extending from the region of the piriform sinus down to the level of the lower left neck. Indistinct inflammatory change with loss of the fat planes between the sternocleidomastoid muscle and strap musculature. No evidence of enlarging discrete mass lesion. There is some hyperenhancing tissue adjacent to the piriform sinus and along the anterior margin of the left sternocleidomastoid muscle. 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 I do not see any evidence of suppurated lymph node, foreign object or thyroid disease. Electronically Signed   By: Paulina FusiMark  Shogry M.D.   On: 09/04/2018 15:41    Procedures Procedures (including critical care time)  Medications Ordered in ED Medications  dexamethasone (DECADRON) injection 10 mg (10 mg Intravenous Given 09/04/18 1523)  iohexol (OMNIPAQUE) 300 MG/ML solution 75 mL (75 mLs Intravenous Contrast Given 09/04/18 1518)  HYDROcodone-acetaminophen (NORCO/VICODIN) 5-325 MG per tablet 1 tablet (1 tablet Oral Given 09/04/18 1616)     Initial Impression / Assessment and Plan / ED Course  I have reviewed the triage vital signs and the nursing notes.  Pertinent labs & imaging results that were available during my care of the patient were reviewed by me and considered in my medical decision making (see chart for details).        Pt presenting for evaluation of left-sided neck pain and swelling.  Physical exam reassuring in that patient is afebrile not  tachycardic.  Appears nontoxic.  However, significant swelling noted of the left anterior lateral neck.  Additionally, I am concerned about patient's last CT, that showed abscess formation within the SCM.  As such, will obtain labs and repeat CT for further evaluation.  Labs reassuring, no leukocytosis.  No obvious signs of systemic infection.  CT pending.  CT shows "redemonstration of inflammatory process in the deep left neck extending from the reformed sinus to the left lower neck.  Patient with inflammatory changes with loss of the fat planes between the SCM and strap musculature.  Some hyperenhancing tissue adjacent to the piriform sinus along the anterior margin of the left SCM.  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 Rodena MedinMessick agrees to plan.  Will consult with ENT. Discussed with Dr. Rulon Seraeah from ENT, who recommends steroids and antibiotics, as discussed treatment his symptoms previously.  Recommends follow-up in the office for outpatient procedure.  Recommends patient [facility], as he is not independent prior to the procedure.  Discussed findings and plan with patient.  Discussed importance of outpatient procedure, otherwise symptoms will continue to occur.  At this time, patient appears safe for discharge.  Return precautions given.  Patient states he understands and agrees to plan.  Final Clinical Impressions(s) / ED Diagnoses   Final diagnoses:  Branchial cleft cyst    ED Discharge Orders         Ordered    amoxicillin-clavulanate (AUGMENTIN) 875-125 MG tablet  Every 12 hours     09/04/18 1610           [place]av[place], MillwoodSophia, PA-C 09/04/18 1632    Wynetta FinesMessick, Peter C, MD 09/05/18 1536

## 2018-09-04 NOTE — Discharge Instructions (Addendum)
Take the Augmentin as prescribed.  Take the entire course, even if your symptoms improve. Take ibuprofen 3 times a day with meals. Take 800 mg (4 pills) at a time. Do not take other anti-inflammatories at the same time (Advil, Motrin, naproxen, Aleve). You may supplement with Tylenol if you need further pain control. It is very important that you call Dr. Deeann Saint office to set up a follow-up appointment.  When you call, you should tell them that you were told to follow-up at the [facility].  Return to the emergency room if you develop fever, worsening swelling, difficulty swallowing, any new, worsening, concerning symptoms.

## 2018-09-15 ENCOUNTER — Encounter (HOSPITAL_COMMUNITY): Payer: Self-pay

## 2018-09-15 ENCOUNTER — Other Ambulatory Visit: Payer: Self-pay

## 2018-09-15 ENCOUNTER — Emergency Department (HOSPITAL_COMMUNITY)
Admission: EM | Admit: 2018-09-15 | Discharge: 2018-09-15 | Disposition: A | Discharge status: Home / Self Care | Payer: Self-pay | Attending: Emergency Medicine | Admitting: Emergency Medicine

## 2018-09-15 DIAGNOSIS — Q180 Sinus, fistula and cyst of branchial cleft: Secondary | ICD-10-CM | POA: Insufficient documentation

## 2018-09-15 MED ORDER — CLINDAMYCIN HCL 300 MG PO CAPS: 300.0000 mg | ORAL_CAPSULE | Freq: Three times a day (TID) | ORAL | 0 refills | Status: AC

## 2018-09-15 MED ORDER — PREDNISONE 20 MG PO TABS
60.0000 mg | ORAL_TABLET | Freq: Once | ORAL | Status: AC
  Administered 2018-09-15: 60 mg via ORAL
  Filled 2018-09-15: qty 3

## 2018-09-15 MED ORDER — CLINDAMYCIN HCL 150 MG PO CAPS
300.0000 mg | ORAL_CAPSULE | Freq: Once | ORAL | Status: AC
  Administered 2018-09-15: 300 mg via ORAL
  Filled 2018-09-15: qty 2

## 2018-09-15 MED ORDER — HYDROCODONE-ACETAMINOPHEN 5-325 MG PO TABS
1.0000 | ORAL_TABLET | Freq: Once | ORAL | Status: AC
  Administered 2018-09-15: 1 via ORAL
  Filled 2018-09-15: qty 1

## 2018-09-15 MED ORDER — PREDNISONE 10 MG PO TABS: 50.0000 mg | ORAL_TABLET | Freq: Every day | ORAL | 0 refills | Status: AC

## 2018-09-15 NOTE — ED Notes (Signed)
Patient verbalizes understanding of discharge instructions. Opportunity for questioning and answers were provided. Armband removed by staff, pt discharged from ED.  

## 2018-09-15 NOTE — Discharge Instructions (Addendum)
Follow up with ENT- plan to see ENT near the end of your 2 week course of antibiotics. Take Clindamycin as prescribed and complete the full course. Take Prednisone as prescribed and complete the full course.

## 2018-09-15 NOTE — ED Notes (Signed)
Case Management at pt bedside. 

## 2018-09-15 NOTE — ED Triage Notes (Signed)
Pt arrive POV from home c/o a cyst in neck that swells up "every now and then." Pt reports cyst became "hard" 2 weeks ago and came to ED for evaluation; pt took antibx and reports cyst "got bigger." Pt a&o x4 and is not in respiratory distress; O2 sat 99% on room air.

## 2018-09-15 NOTE — ED Provider Notes (Signed)
[place] EMERGENCY DEPARTMENT Provider Note   CSN: [id number] Arrival date & time: 09/15/18  1304    History   Chief Complaint Chief Complaint  Patient presents with  . Cyst    HPI Brad King is a 28 y.o. male.     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 Patient states he called ENT for follow up but never got a call back to schedule an appointment. Patient is here today due to ongoing pain, slightly worsening swelling, states there is an area that is now soft and would like this drained in the ER today. Denies fevers, difficulty breathing or swallowing.      Past Medical History:  Diagnosis Date  . Migraines     There are no active problems to display for this patient.   History reviewed. No pertinent surgical history.      Home Medications    Prior to Admission medications   Medication Sig Start Date End Date Taking? Authorizing Provider  acetaminophen (TYLENOL) 325 MG tablet Take 650 mg by mouth every 6 (six) hours as needed for mild pain.    [provider]  clindamycin (CLEOCIN) 300 MG capsule Take 1 capsule (300 mg total) by mouth 3 (three) times daily for 14 days. 09/15/18 09/29/18  Tacy Learn, PA-C  predniSONE (DELTASONE) 10 MG tablet Take 5 tablets (50 mg total) by mouth daily for 4 days. 09/15/18 09/19/18  Tacy Learn, PA-C    Family History History reviewed. No pertinent family history.  Social History Social History   Tobacco Use  . Smoking status: Never Smoker  . Smokeless tobacco: Never Used  Substance Use Topics  . Alcohol use: No  . Drug use: No     Allergies   Patient has no known allergies.   Review of Systems Review of Systems  Constitutional: Negative for chills and fever.  HENT: Negative for trouble swallowing and voice change.   Respiratory: Negative for shortness of breath.    Gastrointestinal: Negative for nausea and vomiting.  Musculoskeletal: Positive for neck pain.  Skin: Negative for rash and wound.  Allergic/Immunologic: Negative for immunocompromised state.  Psychiatric/Behavioral: Negative for confusion.  All other systems reviewed and are negative.    Physical Exam Updated Vital Signs BP 121/77 (BP Location: Right Arm)   Pulse 61   Temp 98.7 F (37.1 C) (Oral)   Resp 16   Ht 5\' 11"  (1.803 m)   Wt 106.6 kg   SpO2 99%   BMI 32.78 kg/m   Physical Exam Vitals signs and nursing note reviewed.  Constitutional:      General: He is not in acute distress.    Appearance: He is well-developed. He is not diaphoretic.  HENT:     Head: Normocephalic and atraumatic.  Neck:   Cardiovascular:     Rate and Rhythm: Normal rate and regular rhythm.     Pulses: Normal pulses.     Heart sounds: Normal heart sounds.  Pulmonary:     Effort: Pulmonary effort is normal.     Breath sounds: Normal breath sounds.  Skin:    General: Skin is warm and dry.     Findings: No erythema.  Neurological:     Mental Status: He is alert and oriented to person, place, and time.  Psychiatric:        Behavior: Behavior  normal.      ED Treatments / Results  Labs (all labs ordered are listed, but only abnormal results are displayed) Labs Reviewed - No data to display  EKG None  Radiology No results found.  Procedures Procedures (including critical care time)  Medications Ordered in ED Medications  clindamycin (CLEOCIN) capsule 300 mg (300 mg Oral Given 09/15/18 1515)  predniSONE (DELTASONE) tablet 60 mg (60 mg Oral Given 09/15/18 1516)  HYDROcodone-acetaminophen (NORCO/VICODIN) 5-325 MG per tablet 1 tablet (1 tablet Oral Given 09/15/18 1624)     Initial Impression / Assessment and Plan / ED Course  I have reviewed the triage vital signs and the nursing notes.  Pertinent labs & imaging results that were available during my care of the patient were reviewed  by me and considered in my medical decision making (see chart for details).  Clinical Course as of Sep 14 1653  Fri Sep 15, 2018  [id number] 28 year old male with history of left brachial cleft cyst, last seen in the ER on September 04, 2018 and given Decadron with Augmentin.  Patient had a CT soft tissue neck with lab work at that time, no evidence of abscess.  Patient states that he completed the antibiotics and has not had any improvement in his pain or swelling, states the area seems more swollen to him, he states there is a soft area that he thinks could be drained in the emergency room today.  Patient did not follow-up with ENT due to lack of insurance.  Patient does not have any difficulty breathing or swallowing, on exam he has swelling to the anterior lateral left neck without any overlying erythema.  Patient is afebrile.  I do not suspect new development of abscess or cellulitis and will not do any additional imaging at this time as patient last had a CT 11 days ago.  Case discussed with Dr. Silverio LayYao, ER attending, agrees with consult to ENT.  Case discussed with Dr[facility] Annalee [facility]entaShoemaker [facility] who has reviewed patient's chart, recommends clindamycin 300 mg 3 times daily x2 weeks, will also do Medrol Dosepak x3 days with plan to see patient near the end of the 2-week antibiotic course.  Discussed plan of care with patient who still feels like he is unable to follow-up with ENT as he does not have insurance.  Social work paged for consult.   [LM]  1654 Patient was seen by Burna MortimerWanda, RN, with case management who has discussed resources with patient.    [LM]    Clinical Course User Index [LM] Jeannie FendMurphy, Bettymae Yott A, PA-C      Final Clinical Impressions(s) / ED Diagnoses   Final diagnoses:  Branchial cleft cyst    ED Discharge Orders         Ordered    clindamycin (CLEOCIN) 300 MG capsule  3 times daily     09/15/18 1638    predniSONE (DELTASONE) 10 MG tablet  Daily     09/15/18 1638           Jeannie FendMurphy,  Candy Leverett A, PA-C 09/15/18 1655    Charlynne PanderYao, David Hsienta, MD 09/17/18 2030

## 2019-11-05 IMAGING — CT CT NECK W/ CM
4 of 5 series · 15 of 33 positions shown, 17 images · IV contrast (APPLIED)
Comparison: None.

CLINICAL DATA: Sore throat and stridor

EXAM:
CT NECK WITH CONTRAST
TECHNIQUE: Multidetector CT imaging of the neck was performed using the
standard protocol following the bolus administration of intravenous
contrast.
CONTRAST:  100mL OMNIPAQUE IOHEXOL 300 MG/ML  SOLN

[Series 3: neck 2.0 i31s 3 · axial · 0.44mm/px · z∈[-248,-98]mm · 4 of 127 slices shown, 5 images]
[im 26/127  soft-tissue]
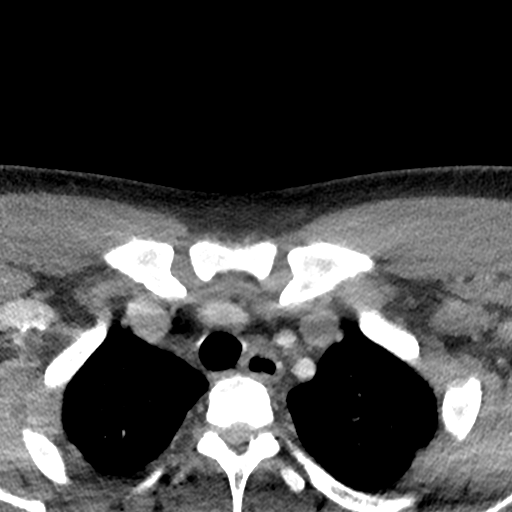
[im 26/127  bone]
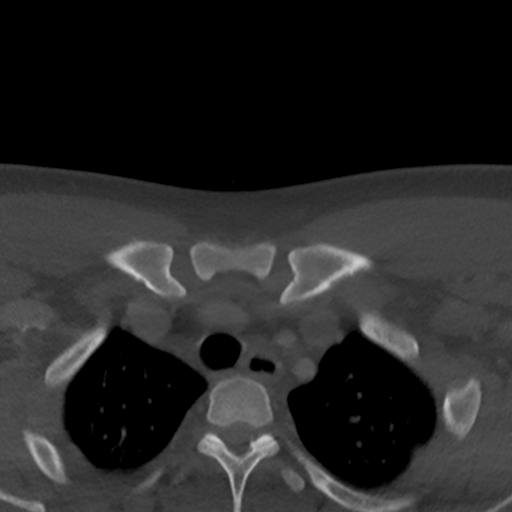
[im 51/127  bone]
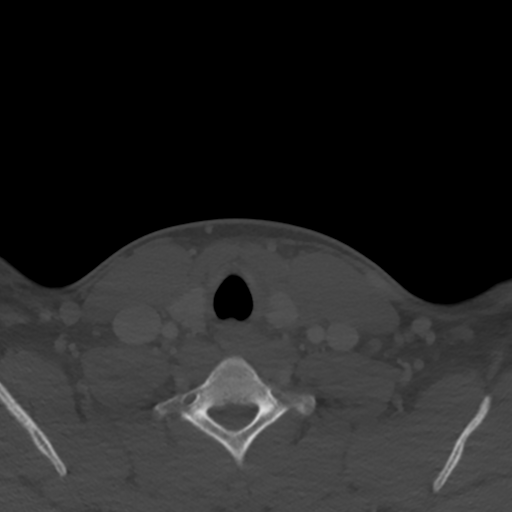
[im 76/127  bone]
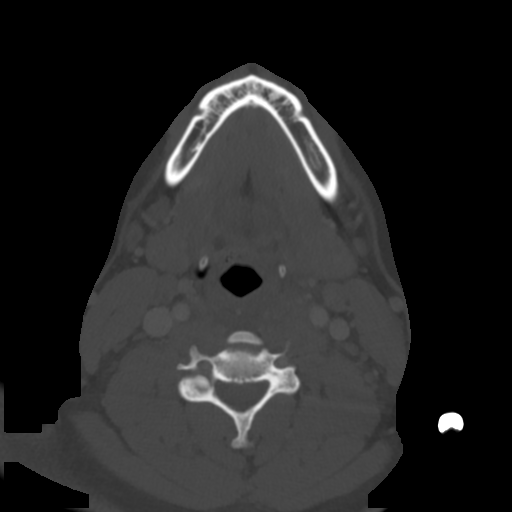
[im 101/127  bone]
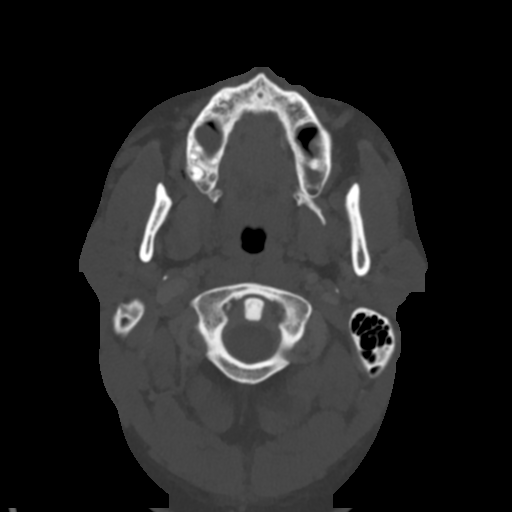

[Series 7: coronal st · coronal · 0.44mm/px · 3 of 104 slices shown]
[im 21/104  bone]
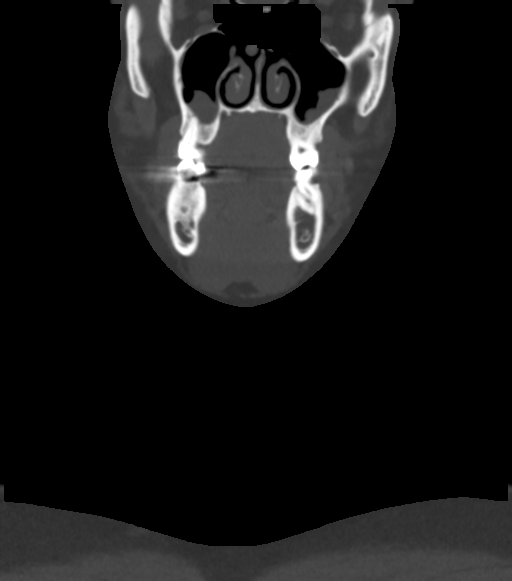
[im 42/104  bone]
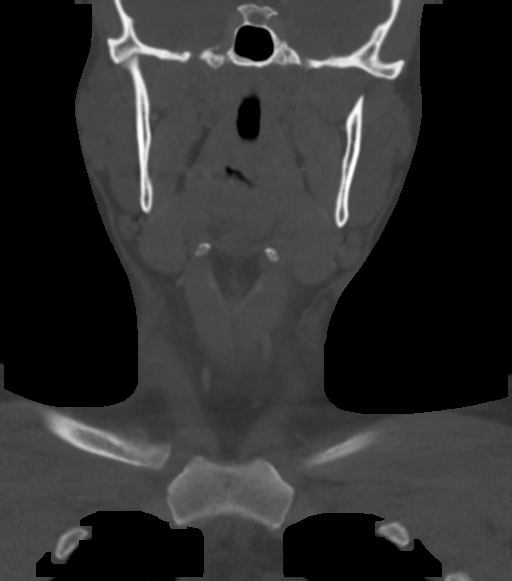
[im 62/104  bone]
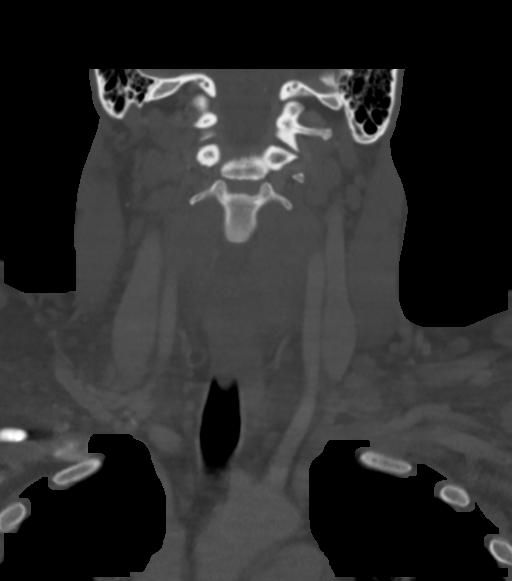

[Series 8: sagittal st · sagittal · 0.50mm/px · 5 of 81 slices shown, 6 images]
[im 27/81  bone]
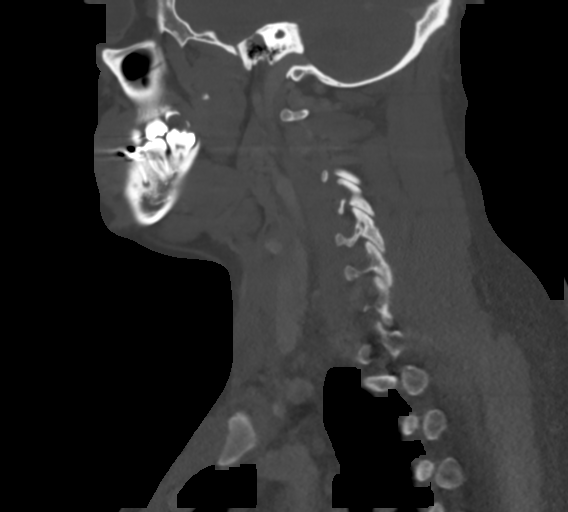
[im 34/81  bone]
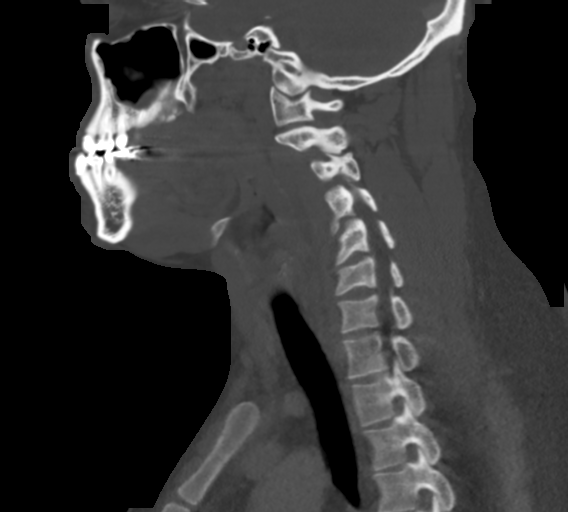
[im 41/81  soft-tissue]
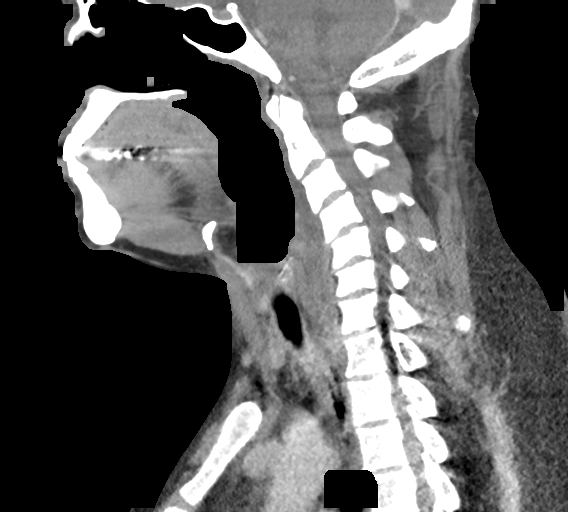
[im 41/81  bone]
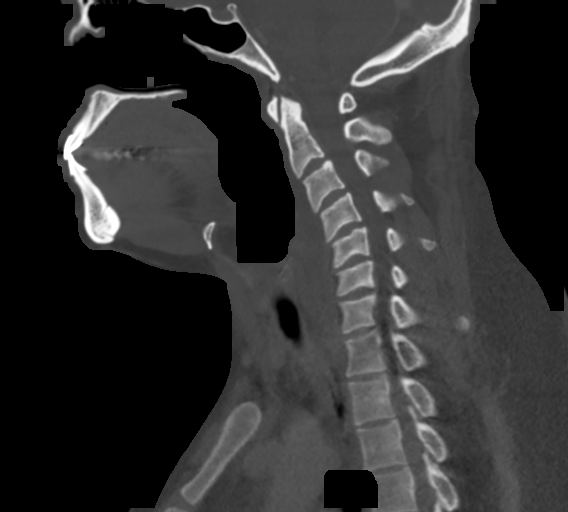
[im 47/81  bone]
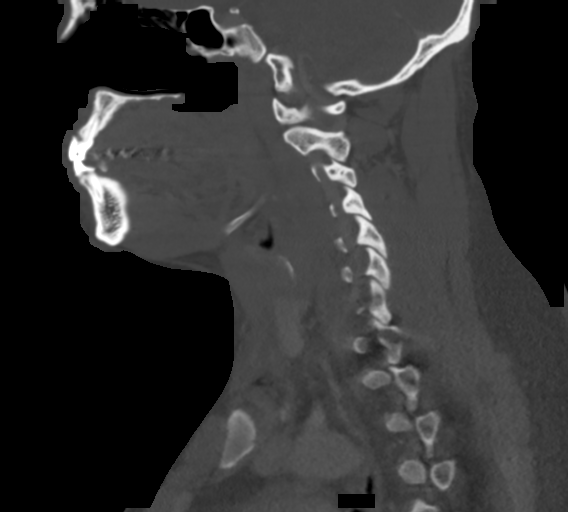
[im 54/81  bone]
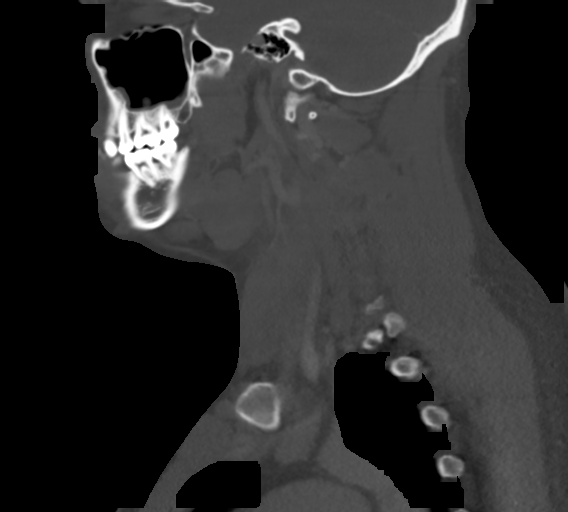

[Series 9: orthogonal st · axial · 0.39mm/px · z∈[-246,-146]mm · 3 of 126 slices shown]
[im 26/126  bone]
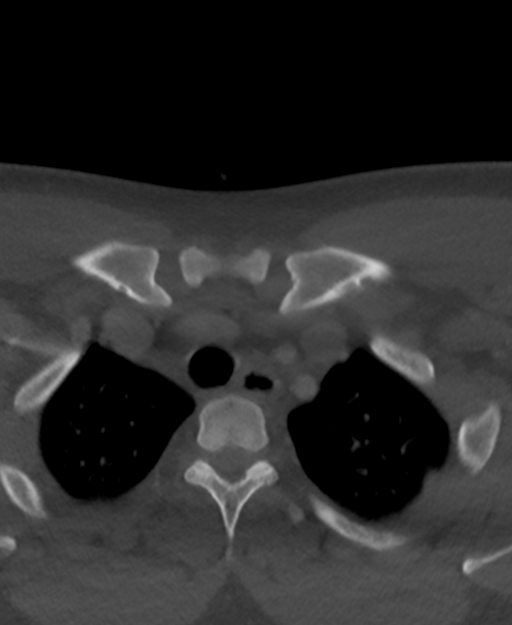
[im 51/126  bone]
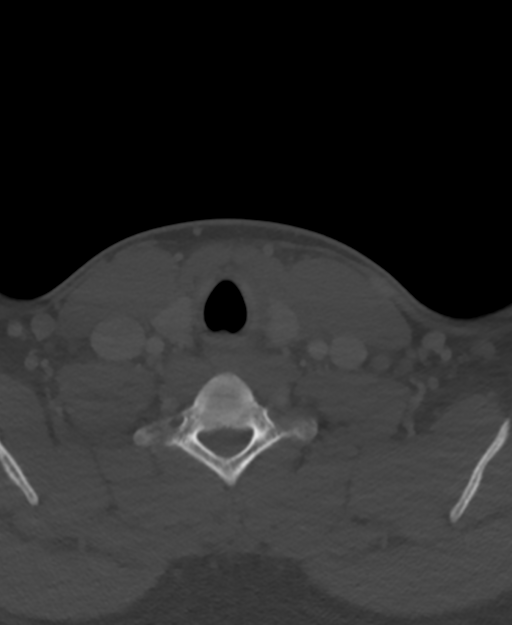
[im 76/126  bone]
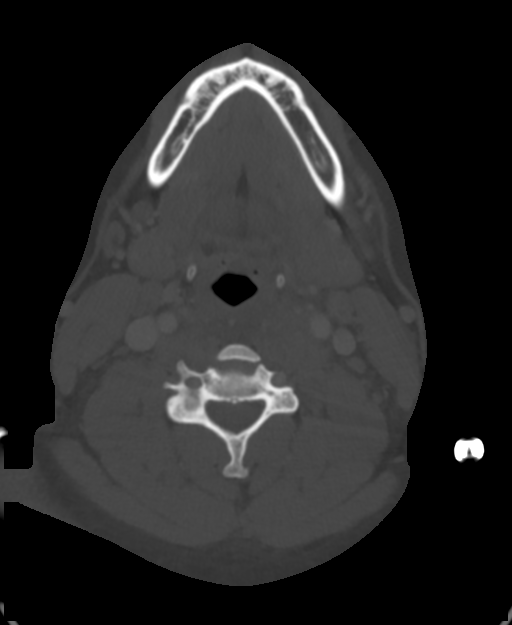

[15 of 33 positions shown; findings below may reference images not displayed]

FINDINGS: PHARYNX AND LARYNX:

--Nasopharynx: Fossae of [place] are clear. Normal adenoid
tonsils for age.

--Oral cavity and oropharynx: The palatine and lingual tonsils are
normal. The visible oral cavity and floor of mouth are normal.

--Hypopharynx: Normal vallecula and pyriform sinuses.

--Larynx: Normal epiglottis and pre-epiglottic space. Normal
aryepiglottic and vocal folds.

--Retropharyngeal space: There is a retropharyngeal effusion
measuring up to 7 mm in thickness extending from the oropharynx to
the level of the thyroid. There are enlarged retropharyngeal lymph
nodes that measure up to 2.3 cm on the left.

SALIVARY GLANDS:

--Parotid: No mass lesion or inflammation. No sialolithiasis or
ductal dilatation.

--Submandibular: Symmetric without inflammation. No sialolithiasis
or ductal dilatation.

--Sublingual: Normal. No ranula or other visible lesion of the base
of tongue and floor of mouth.

THYROID: Normal.

LYMPH NODES: As above, enlarged left retropharyngeal node measuring
up to 2.3 cm. Bilateral level 2A nodes measure up to 10 mm.

VASCULAR: Major cervical vessels are patent.

LIMITED INTRACRANIAL: Normal.

VISUALIZED ORBITS: Normal.

MASTOIDS AND VISUALIZED PARANASAL SINUSES: Small polyps within both
maxillary sinuses.

SKELETON: No bony spinal canal stenosis. No lytic or blastic
lesions.

UPPER CHEST: Clear.

OTHER: None.
IMPRESSION: 1. Retropharyngeal edema/effusion with enlarged, reactive
retropharyngeal and level 2A cervical nodes.
2. No airway compromise.
3. Normal tonsils.

## 2020-04-06 IMAGING — CT CT NECK W/ CM
4 of 5 series · 14 of 33 positions shown, 16 images · IV contrast (Omni 300)
Comparison: CT neck October 19, 2007

CLINICAL DATA: Sore throat for 2 weeks. LEFT neck pain and swelling
for 1 week.

EXAM:
CT NECK WITH CONTRAST
TECHNIQUE: Multidetector CT imaging of the neck was performed using the
standard protocol following the bolus administration of intravenous
contrast.
CONTRAST:  75mL OMNIPAQUE IOHEXOL 300 MG/ML  SOLN

[Series 9: neck 2.0 st 2 · axial · 0.49mm/px · z∈[-245,-113]mm · 4 of 111 slices shown, 5 images (1 of 3)]
[im 23/111  soft-tissue]
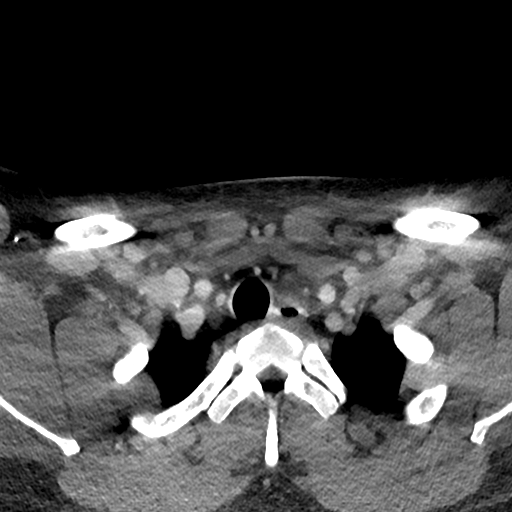
[im 23/111  bone]
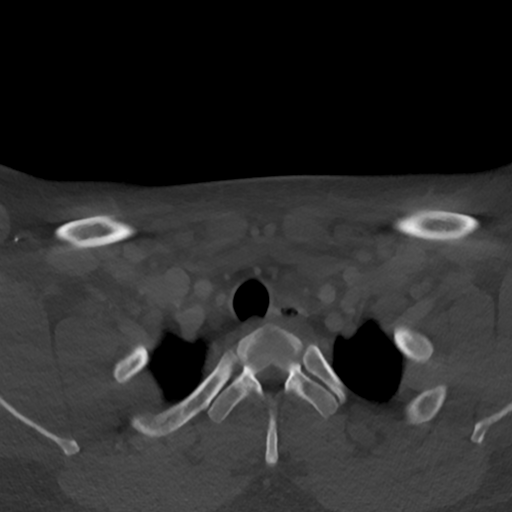
[im 45/111  bone]
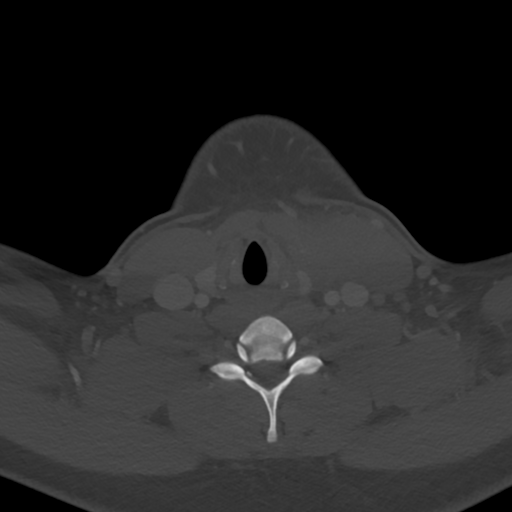
[im 67/111  bone]
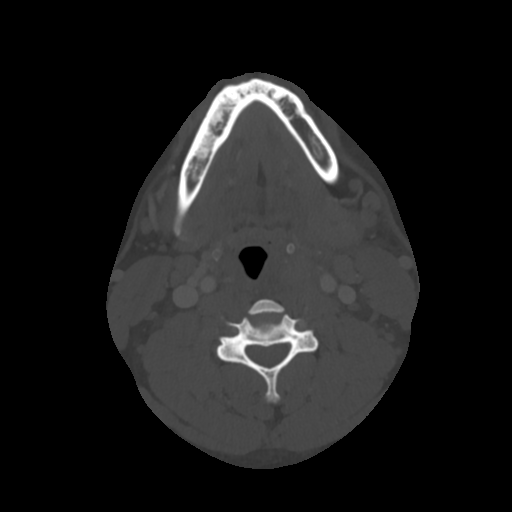
[im 89/111  bone]
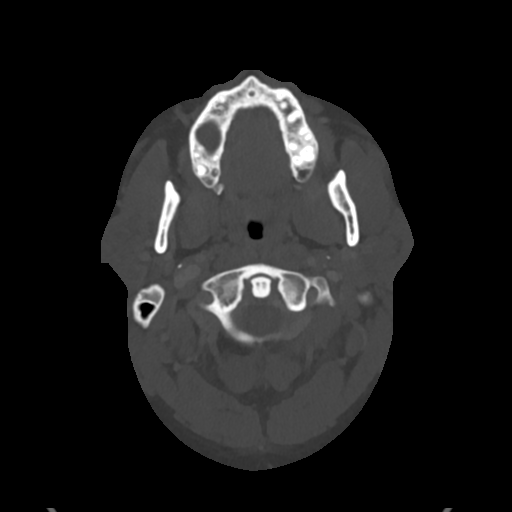

[Series 10: neck 2.0 st 2 · sagittal · 0.46mm/px · 5 of 96 slices shown, 6 images (2 of 3)]
[im 32/96  bone]
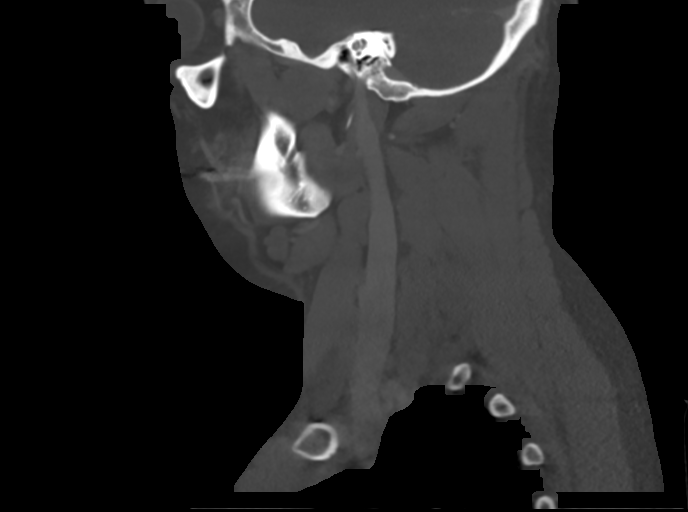
[im 40/96  bone]
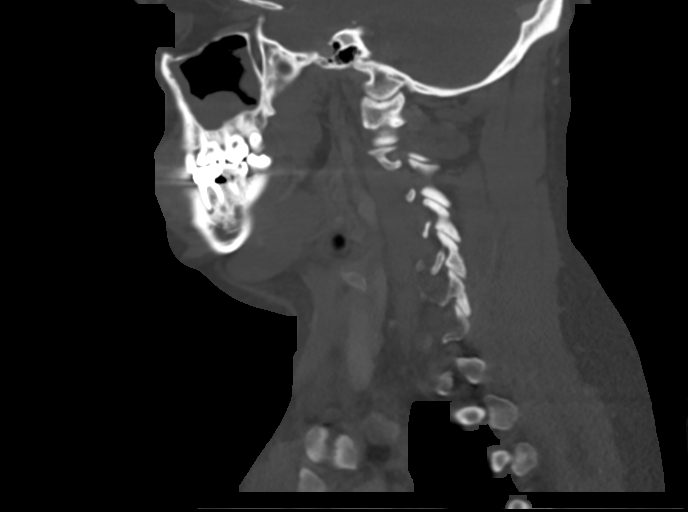
[im 48/96  soft-tissue]
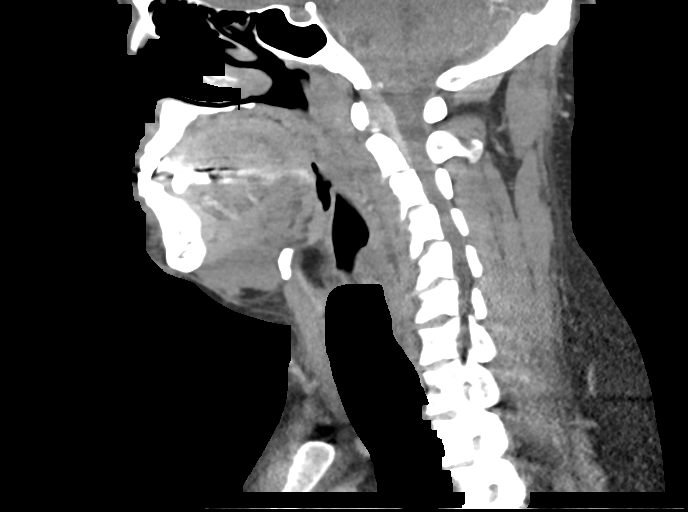
[im 48/96  bone]
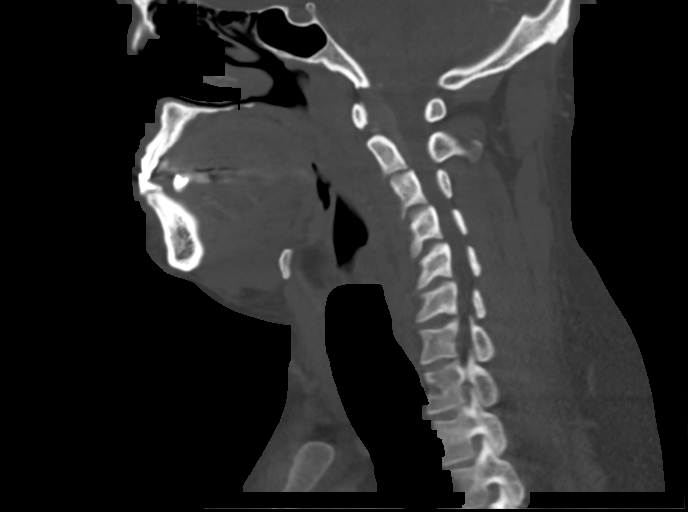
[im 56/96  bone]
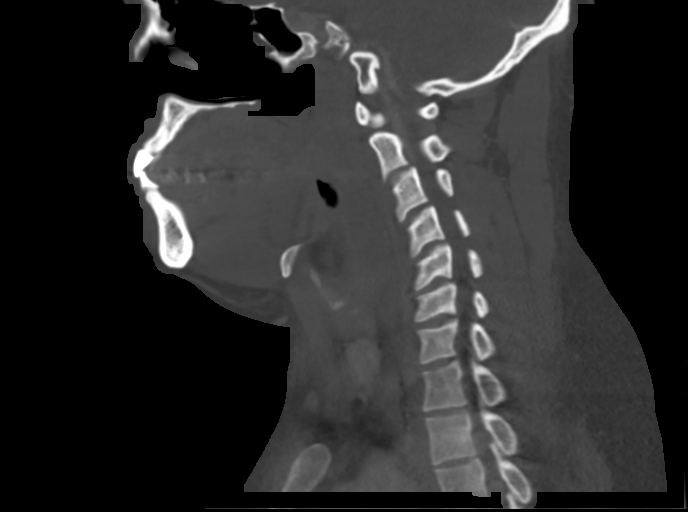
[im 64/96  bone]
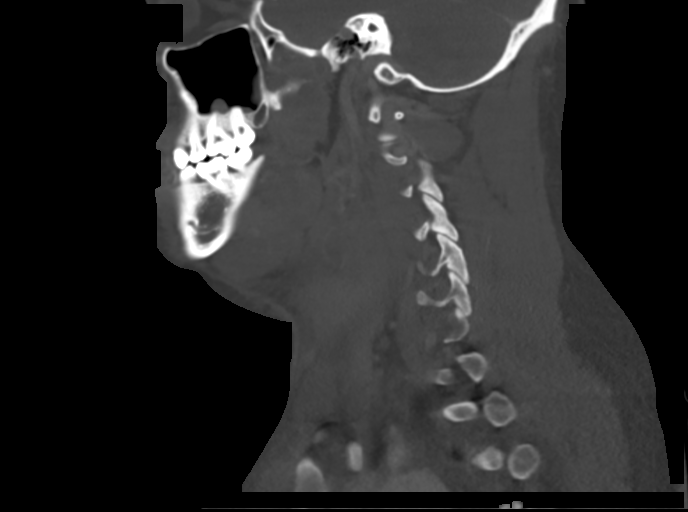

[Series 12: neck 2.0 st 2 · coronal · 0.43mm/px · 3 of 123 slices shown (3 of 3)]
[im 25/123  bone]
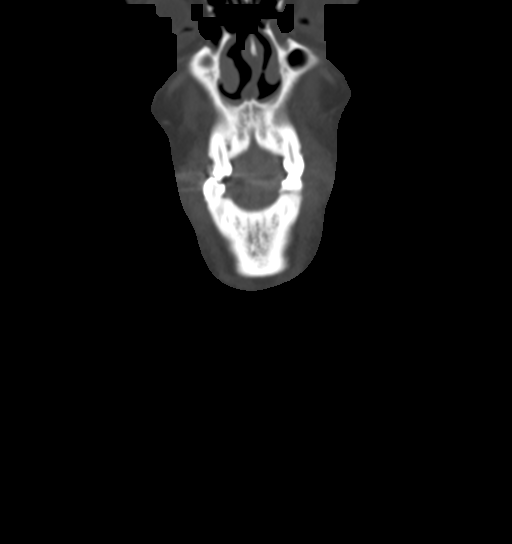
[im 49/123  bone]
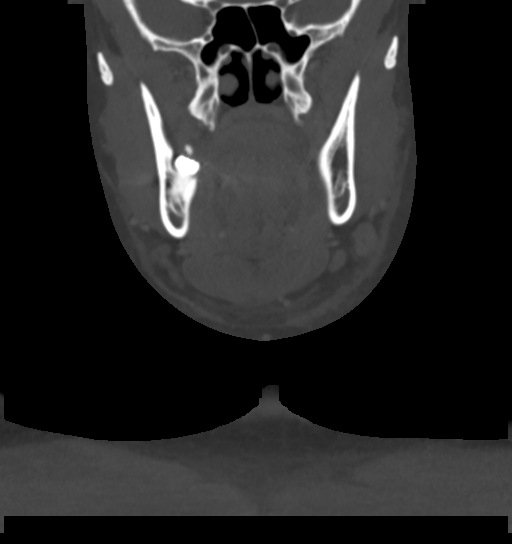
[im 74/123  bone]
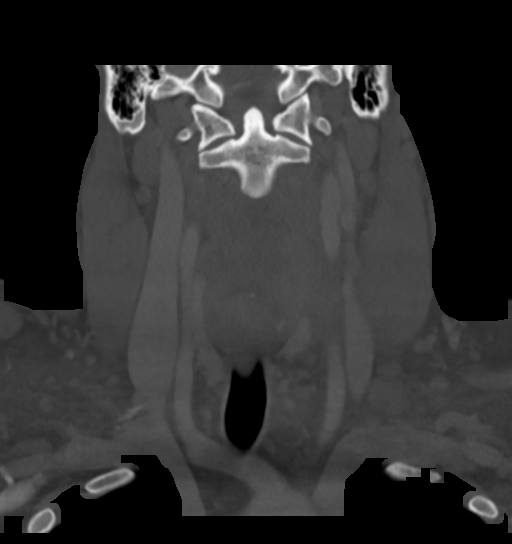

[Series 13: neck 2.0 st orthogonal · axial · 0.39mm/px · z∈[-268,-223]mm · 2 of 115 slices shown]
[im 23/115  bone]
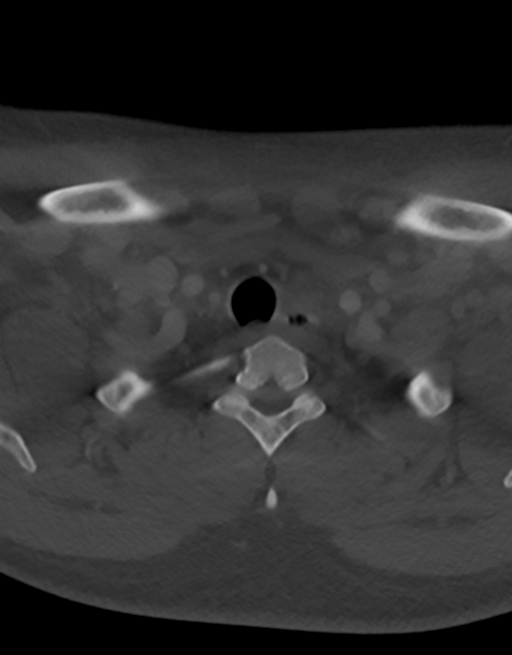
[im 46/115  bone]
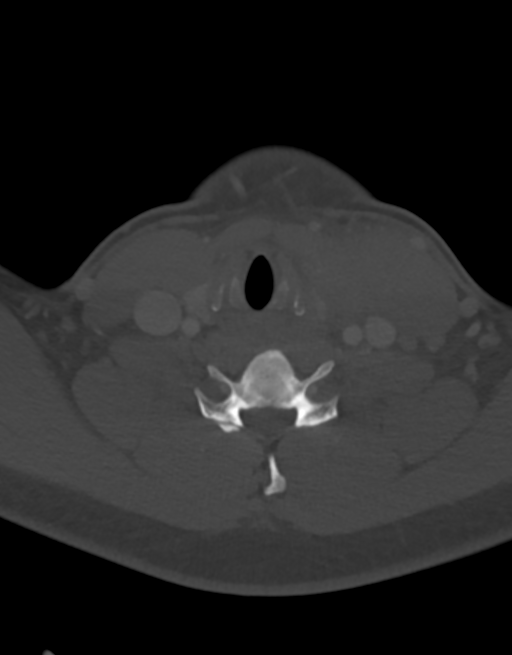

[14 of 33 positions shown; findings below may reference images not displayed]

FINDINGS: PHARYNX AND LARYNX: Ill-defined enhancing 2.3 x 1.8 cm mass LEFT
piriform sinus to supraglottic fat and its LEFT carotid space mass.
Small retropharyngeal effusion. Normal larynx. Patent airway.

SALIVARY GLANDS: Normal.

THYROID: Normal.

LYMPH NODES: Mild cervical reactive lymphadenopathy.

VASCULAR: Normal.

LIMITED INTRACRANIAL: Normal.

VISUALIZED ORBITS: Normal.

MASTOIDS AND VISUALIZED PARANASAL SINUSES: Mild-to-moderate
paranasal sinusitis without air-fluid levels. Mastoid air cells are
well aerated.

SKELETON: Enlarged LEFT distal sternocleidomastoid muscle with
superimposed 1.6 by 2.4 x 2.3 cm (transverse by AP by cc) enhancing
complex mass (series 9, image 59). Mildly enlarged edematous LEFT
strap muscles. Thickened LEFT platysma. RIGHT maxillary molar
periapical abscess and dental Haflingerzucht. Mild RIGHT upper cervical facet
arthropathy.

UPPER CHEST: Lung apices are clear. No superior mediastinal
lymphadenopathy.

OTHER: None.
IMPRESSION: 1. Enhancing 2.3 x 1.8 cm mass versus phlegmon abutting the piriform
sinus. Associated LEFT neck transspatial edema. LEFT
sternocleidomastoid mastoid myositis and 1.6 x 2.4 x 2.3 cm early
abscess within or contiguous with the LEFT SCM. Constellation of
findings seen with branchial cleft cyst type 4 with sinus tract.
2. Patent airway.  Mild cervical lymphadenopathy.
3. Recommend ENT consultation.
4. Acute findings discussed with and reconfirmed by RUTI KAMBOJ on

## 2021-08-02 ENCOUNTER — Emergency Department (HOSPITAL_COMMUNITY)
Admission: EM | Admit: 2021-08-02 | Discharge: 2021-08-02 | Disposition: A | Discharge status: Home / Self Care | Payer: Self-pay | Attending: Emergency Medicine | Admitting: Emergency Medicine

## 2021-08-02 ENCOUNTER — Encounter (HOSPITAL_COMMUNITY): Payer: Self-pay | Admitting: Emergency Medicine

## 2021-08-02 ENCOUNTER — Other Ambulatory Visit: Payer: Self-pay

## 2021-08-02 DIAGNOSIS — N342 Other urethritis: Secondary | ICD-10-CM | POA: Insufficient documentation

## 2021-08-02 LAB — URINALYSIS, ROUTINE W REFLEX MICROSCOPIC
Bacteria, UA: NONE SEEN
Bilirubin Urine: NEGATIVE
Glucose, UA: 50 mg/dL — AB
Ketones, ur: NEGATIVE mg/dL
Nitrite: NEGATIVE
Protein, ur: 30 mg/dL — AB
Specific Gravity, Urine: 1.02 (ref 1.005–1.030)
WBC, UA: >50 WBC/hpf — ABNORMAL HIGH (ref 0–5)
pH: 5 (ref 5.0–8.0)

## 2021-08-02 MED ORDER — CEFTRIAXONE SODIUM 500 MG IJ SOLR
500.0000 mg | Freq: Once | INTRAMUSCULAR | Status: AC
  Administered 2021-08-02: 500 mg via INTRAMUSCULAR
  Filled 2021-08-02: qty 500

## 2021-08-02 MED ORDER — LIDOCAINE HCL (PF) 1 % IJ SOLN
INTRAMUSCULAR | Status: AC
  Administered 2021-08-02: 1 mL
  Filled 2021-08-02: qty 5

## 2021-08-02 MED ORDER — DOXYCYCLINE HYCLATE 100 MG PO CAPS: 100.0000 mg | ORAL_CAPSULE | Freq: Two times a day (BID) | ORAL | 0 refills | Status: DC

## 2021-08-02 NOTE — Discharge Instructions (Addendum)
Return if any problems.

## 2021-08-02 NOTE — ED Triage Notes (Signed)
Presents from home for penile discharge of blood and pus x 4 days with bleeding started today, blood in urine. Endorses burning with urination, low abd pain while sitting. Denies N/V, fever. ? ?Does engage in unprotected sex with single partner. ? ? ?

## 2021-08-02 NOTE — ED Provider Notes (Signed)
?[facility] EMERGENCY DEPARTMENT ?Provider Note ? ? ?CSN: [id number] ?Arrival date & time: 08/02/21  0342 ? ?  ? ?History ? ?Chief Complaint  ?Patient presents with  ? Penile Discharge  ? ? ?Brad King is a 31 y.o. male. ? ?Pt complains of a penile discharge  ? ?The history is provided by the patient. No language interpreter was used.  ?Penile Discharge ?This is a new problem. The current episode started more than 2 days ago. The problem occurs constantly. The problem has not changed since onset.Nothing aggravates the symptoms. He has tried nothing for the symptoms. The treatment provided no relief.  ? ?  ? ?Home Medications ?Prior to Admission medications   ?Medication Sig Start Date End Date Taking? Authorizing Provider  ?acetaminophen (TYLENOL) 325 MG tablet Take 650 mg by mouth every 6 (six) hours as needed for mild pain.    [provider]  ?   ? ?Allergies    ?Patient has no known allergies.   ? ?Review of Systems   ?Review of Systems  ?Genitourinary:  Positive for penile discharge.  ?All other systems reviewed and are negative. ? ?Physical Exam ?Updated Vital Signs ?BP (!) 137/103 (BP Location: Right Arm)   Pulse 96   Temp 99 ?F (37.2 ?C) (Oral)   Resp 16   Wt 111.1 kg   SpO2 97%   BMI 34.17 kg/m?  ?Physical Exam ?Vitals and nursing note reviewed.  ?Constitutional:   ?   Appearance: He is well-developed.  ?HENT:  ?   Head: Normocephalic.  ?Pulmonary:  ?   Effort: Pulmonary effort is normal.  ?Abdominal:  ?   General: There is no distension.  ?Genitourinary: ?   Penis: Normal.   ?   Comments: Yellow discharge ?Musculoskeletal:     ?   General: Normal range of motion.  ?   Cervical back: Normal range of motion.  ?Neurological:  ?   Mental Status: He is alert and oriented to person, place, and time.  ? ? ?ED Results / Procedures / Treatments   ?Labs ?(all labs ordered are listed, but only abnormal results are displayed) ?Labs Reviewed  ?URINALYSIS, ROUTINE W REFLEX MICROSCOPIC -  Abnormal; Notable for the following components:  ?    Result Value  ? APPearance CLOUDY (*)   ? Glucose, UA 50 (*)   ? Hgb urine dipstick LARGE (*)   ? Protein, ur 30 (*)   ? Leukocytes,Ua LARGE (*)   ? WBC, UA >50 (*)   ? All other components within normal limits  ?GC/CHLAMYDIA PROBE AMP ([place]) NOT AT [facility]  ? ? ?EKG ?None ? ?Radiology ?No results found. ? ?Procedures ?Procedures  ? ? ?Medications Ordered in ED ?Medications  ?cefTRIAXone (ROCEPHIN) injection 500 mg (500 mg Intramuscular Given 08/02/21 0710)  ?lidocaine (PF) (XYLOCAINE) 1 % injection (1 mL  Given 08/02/21 0711)  ? ? ?ED Course/ Medical Decision Making/ A&P ?  ?                        ?Medical Decision Making ?Amount and/or Complexity of Data Reviewed ?Labs: ordered. ? ?Risk ?Prescription drug management. ? ? ?Pt denies std risk.  Pt's partner with him.  She denies symptoms  Gc and ct pending.  Pt given injection of Rocephin and rx for doxycycline  ? ? ? ? ? ? ? ?Final Clinical Impression(s) / ED Diagnoses ?Final diagnoses:  ?Urethritis  ? ? ?Rx / DC  Orders ?ED Discharge Orders   ? ?      Ordered  ?  doxycycline (VIBRAMYCIN) 100 MG capsule  2 times daily       ? 08/02/21 0722  ? ?  ?  ? ?  ?An After Visit Summary was printed and given to the patient.  ? ?  ?Elson Areas, [place] ?08/02/21 1696 ? ?  ?Maia Plan, MD ?08/04/21 1012 ? ?

## 2021-08-03 LAB — GC/CHLAMYDIA PROBE AMP (~~LOC~~) NOT AT ARMC
Chlamydia: NEGATIVE
Comment: NEGATIVE
Comment: NORMAL
Neisseria Gonorrhea: POSITIVE — AB

## 2022-10-08 ENCOUNTER — Other Ambulatory Visit: Payer: Self-pay

## 2022-10-08 ENCOUNTER — Encounter (HOSPITAL_COMMUNITY): Payer: Self-pay | Admitting: *Deleted

## 2022-10-08 ENCOUNTER — Emergency Department (HOSPITAL_COMMUNITY)
Admission: EM | Admit: 2022-10-08 | Discharge: 2022-10-09 | Disposition: A | Discharge status: Home / Self Care | Payer: Self-pay | Attending: Emergency Medicine | Admitting: Emergency Medicine

## 2022-10-08 DIAGNOSIS — K029 Dental caries, unspecified: Secondary | ICD-10-CM | POA: Insufficient documentation

## 2022-10-08 DIAGNOSIS — K047 Periapical abscess without sinus: Secondary | ICD-10-CM | POA: Insufficient documentation

## 2022-10-08 DIAGNOSIS — R590 Localized enlarged lymph nodes: Secondary | ICD-10-CM | POA: Insufficient documentation

## 2022-10-08 LAB — BASIC METABOLIC PANEL
Anion gap: 11 (ref 5–15)
BUN: 11 mg/dL (ref 6–20)
CO2: 27 mmol/L (ref 22–32)
Calcium: 8.9 mg/dL (ref 8.9–10.3)
Chloride: 102 mmol/L (ref 98–111)
Creatinine, Ser: 1.15 mg/dL (ref 0.61–1.24)
GFR, Estimated: >60 mL/min (ref >60)
Glucose, Bld: 91 mg/dL (ref 70–99)
Potassium: 4 mmol/L (ref 3.5–5.1)
Sodium: 140 mmol/L (ref 135–145)

## 2022-10-08 LAB — CBC WITH DIFFERENTIAL/PLATELET
Abs Immature Granulocytes: 0.01 10*3/uL (ref 0.00–0.07)
Basophils Absolute: 0 10*3/uL (ref 0.0–0.1)
Basophils Relative: 0 %
Eosinophils Absolute: 0.4 10*3/uL (ref 0.0–0.5)
Eosinophils Relative: 5 %
HCT: 39.5 % (ref 39.0–52.0)
Hemoglobin: 12.7 g/dL — ABNORMAL LOW (ref 13.0–17.0)
Immature Granulocytes: 0 %
Lymphocytes Relative: 33 %
Lymphs Abs: 2.4 10*3/uL (ref 0.7–4.0)
MCH: 25.5 pg — ABNORMAL LOW (ref 26.0–34.0)
MCHC: 32.2 g/dL (ref 30.0–36.0)
MCV: 79.2 fL — ABNORMAL LOW (ref 80.0–100.0)
Monocytes Absolute: 0.5 10*3/uL (ref 0.1–1.0)
Monocytes Relative: 7 %
Neutro Abs: 4.1 10*3/uL (ref 1.7–7.7)
Neutrophils Relative %: 55 %
Platelets: 253 10*3/uL (ref 150–400)
RBC: 4.99 MIL/uL (ref 4.22–5.81)
RDW: 13.2 % (ref 11.5–15.5)
WBC: 7.4 10*3/uL (ref 4.0–10.5)
nRBC: 0 % (ref 0.0–0.2)

## 2022-10-08 LAB — I-STAT CHEM 8, ED
BUN: 12 mg/dL (ref 6–20)
Calcium, Ion: 1.14 mmol/L — ABNORMAL LOW (ref 1.15–1.40)
Chloride: 105 mmol/L (ref 98–111)
Creatinine, Ser: 1.2 mg/dL (ref 0.61–1.24)
Glucose, Bld: 86 mg/dL (ref 70–99)
HCT: 40 % (ref 39.0–52.0)
Hemoglobin: 13.6 g/dL (ref 13.0–17.0)
Potassium: 4 mmol/L (ref 3.5–5.1)
Sodium: 141 mmol/L (ref 135–145)
TCO2: 28 mmol/L (ref 22–32)

## 2022-10-08 MED ORDER — LACTATED RINGERS IV BOLUS
1000.0000 mL | Freq: Once | INTRAVENOUS | Status: AC
  Administered 2022-10-08: 1000 mL via INTRAVENOUS

## 2022-10-08 MED ORDER — KETOROLAC TROMETHAMINE 15 MG/ML IJ SOLN
15.0000 mg | Freq: Once | INTRAMUSCULAR | Status: AC
  Administered 2022-10-08: 15 mg via INTRAVENOUS
  Filled 2022-10-08: qty 1

## 2022-10-08 MED ORDER — OXYCODONE-ACETAMINOPHEN 5-325 MG PO TABS
1.0000 | ORAL_TABLET | Freq: Once | ORAL | Status: AC
  Administered 2022-10-08: 1 via ORAL
  Filled 2022-10-08: qty 1

## 2022-10-08 MED ORDER — FENTANYL CITRATE PF 50 MCG/ML IJ SOSY
50.0000 ug | PREFILLED_SYRINGE | Freq: Once | INTRAMUSCULAR | Status: AC
  Administered 2022-10-08: 50 ug via INTRAVENOUS
  Filled 2022-10-08: qty 1

## 2022-10-08 NOTE — ED Triage Notes (Signed)
The pt has had a toothache for 2 weeks but the pain is getting worse  pain on his rt face   last fever reducer at lunch time

## 2022-10-08 NOTE — ED Provider Notes (Signed)
[place] EMERGENCY DEPARTMENT AT [facility] Provider Note  MDM   HPI/ROS:  Brad King is a 32 y.o. male with a medical history as below who presents with complaint of right tooth pain and facial swelling.  He reports that approximately 2 weeks ago he started having some tooth pain in the upper right tooth.  He has been medicating at home with [facility] powder being used as a salve on his tooth, and ibuprofen.  States he has not seen a dentist.  Has not taken any antibiotics.  He has noticed over the last couple days that he had significant right-sided facial swelling and feels as though his right eye, right ear, and right side of his neck are swollen and hot.  Physical exam is notable for: - Poor dentition - Molar #1 with surrounding erythema - Right-sided facial swelling - Right-sided swelling to the neck, at the [facility]  On my initial evaluation, patient is:  -Vital signs stable. Patient afebrile, hemodynamically stable, and non-toxic appearing. -Additional history obtained from significant other bedside  This patient's current presentation, including their history and physical exam, is most consistent with pulpitis versus periapical or periodontal abscess, with additional concern for extension into the masticator space. Differentials include gingivitis.  Presentation is not consistent with PTA or RPA.  No concern for Ludwig's angina.  Will pursue basic lab work and a CT of the soft tissue to evaluate any extension of the dental abscess into the neck.  Dispo likely discharge with oral antibiotics, pending CT.  Interpretations, interventions, and the patient's course of care are documented below.       Disposition:  Patient handed off to oncoming team for continued care and final disposition.  Clinical Impression:  1. Dental infection     Rx / DC Orders ED Discharge Orders     None       The plan for this patient was discussed with Dr. Jearld Fenton, who voiced agreement  and who oversaw evaluation and treatment of this patient.   Clinical Complexity A medically appropriate history, review of systems, and physical exam was performed.  My independent interpretations of EKG, labs, and radiology are documented in the ED course above.   If decision rules were used in this patient's evaluation, they are listed below.   Click here for ABCD2, HEART and other calculatorsREFRESH Note before signing   Patient's presentation is most consistent with acute complicated illness / injury requiring diagnostic workup.  Medical Decision Making Amount and/or Complexity of Data Reviewed Independent Historian: spouse Labs: ordered. Radiology: ordered.  Risk Prescription drug management.    HPI/ROS      See MDM section for pertinent HPI and ROS. A complete ROS was performed with pertinent positives/negatives noted above.   Past Medical History:  Diagnosis Date   Migraines     History reviewed. No pertinent surgical history.    Physical Exam   Vitals:   10/08/22 2032 10/08/22 2037  BP: (!) 147/98   Pulse: 69   Resp: 15   Temp: 98.9 F (37.2 C)   TempSrc: Oral   SpO2: 98%   Weight:  111.1 kg  Height:  5\' 11"  (1.803 m)    Physical Exam Vitals and nursing note reviewed.  Constitutional:      General: He is not in acute distress.    Appearance: He is well-developed.  HENT:     Head: Normocephalic and atraumatic.     Mouth/Throat:     Mouth: Mucous membranes are  moist.     Dentition: Abnormal dentition. Dental tenderness, gingival swelling and dental caries present.     Pharynx: Oropharynx is clear. Uvula midline.   Eyes:     Conjunctiva/sclera: Conjunctivae normal.  Neck:     Trachea: Trachea and phonation normal. No tracheal deviation.  Cardiovascular:     Rate and Rhythm: Normal rate and regular rhythm.     Heart sounds: No murmur heard. Pulmonary:     Effort: Pulmonary effort is normal. No respiratory distress.     Breath sounds: Normal  breath sounds.  Abdominal:     Palpations: Abdomen is soft.     Tenderness: There is no abdominal tenderness.  Musculoskeletal:        General: No swelling.     Cervical back: Neck supple. Edema (Right-sided) and erythema (Right-sided) present.  Lymphadenopathy:     Cervical: Cervical adenopathy present.     Right cervical: Superficial cervical adenopathy present.  Skin:    General: Skin is warm and dry.     Capillary Refill: Capillary refill takes less than 2 seconds.  Neurological:     Mental Status: He is alert.  Psychiatric:        Mood and Affect: Mood normal.      Procedures   If procedures were preformed on this patient, they are listed below:  Procedures   Fayrene Helper, MD Emergency Medicine PGY-2   Please note that this documentation was produced with the assistance of voice-to-text technology and may contain errors.    Fayrene Helper, MD 10/09/22 Rich Fuchs    Loetta Rough, MD 10/09/22 [phone]

## 2022-10-09 ENCOUNTER — Emergency Department (HOSPITAL_COMMUNITY): Payer: Self-pay

## 2022-10-09 MED ORDER — NAPROXEN 500 MG PO TABS: 500.0000 mg | ORAL_TABLET | Freq: Two times a day (BID) | ORAL | 0 refills | Status: DC

## 2022-10-09 MED ORDER — AMOXICILLIN 500 MG PO CAPS: 500.0000 mg | ORAL_CAPSULE | Freq: Three times a day (TID) | ORAL | 0 refills | Status: DC

## 2022-10-09 MED ORDER — AMOXICILLIN 500 MG PO CAPS: 500.0000 mg | ORAL_CAPSULE | Freq: Three times a day (TID) | ORAL | 0 refills | Status: AC

## 2022-10-09 MED ORDER — AMOXICILLIN 500 MG PO CAPS
500.0000 mg | ORAL_CAPSULE | Freq: Once | ORAL | Status: AC
  Administered 2022-10-09: 500 mg via ORAL
  Filled 2022-10-09: qty 1

## 2022-10-09 MED ORDER — OXYCODONE-ACETAMINOPHEN 5-325 MG PO TABS
1.0000 | ORAL_TABLET | Freq: Once | ORAL | Status: AC
  Administered 2022-10-09: 1 via ORAL
  Filled 2022-10-09: qty 1

## 2022-10-09 MED ORDER — IOHEXOL 350 MG/ML SOLN
75.0000 mL | Freq: Once | INTRAVENOUS | Status: AC | PRN
  Administered 2022-10-09: 75 mL via INTRAVENOUS

## 2022-10-09 MED ORDER — NAPROXEN 500 MG PO TABS: 500.0000 mg | ORAL_TABLET | Freq: Two times a day (BID) | ORAL | 0 refills | Status: AC

## 2022-10-09 NOTE — ED Provider Notes (Signed)
1:55 AM I have viewed and interpreted the patient's CT scan which shows evidence of cellulitis and subcentimeter low-density fluid collection consistent with developing dental abscess.  There is no concern for extension of abscess into the soft tissues of the neck.  No necrotizing process or subcutaneous emphysema.  Patient is well-appearing, nontoxic.  He has no trismus, malocclusion.  Phonation normal with midline tongue.  No physical exam findings to raise concern for Ludwig's angina.  Stable for discharge and outpatient dental follow-up.  Started on course of amoxicillin prior to discharge.   CT Soft Tissue Neck W Contrast  Result Date: 10/09/2022 CLINICAL DATA:  Concern for odontogenic abscess with facial swelling EXAM: CT NECK WITH CONTRAST TECHNIQUE: Multidetector CT imaging of the neck was performed using the standard protocol following the bolus administration of intravenous contrast. RADIATION DOSE REDUCTION: This exam was performed according to the departmental dose-optimization program which includes automated exposure control, adjustment of the mA and/or kV according to patient size and/or use of iterative reconstruction technique. CONTRAST:  75mL OMNIPAQUE IOHEXOL 350 MG/ML SOLN COMPARISON:  09/04/2018 FINDINGS: Evaluation of the oral cavity is somewhat limited by beam hardening artifact from the patient's dental hardware. Within this limitation, there are caries in and periapical lucency about the roots of the right maxillary third molar and right mandibular second molar. Cortical dehiscence of the lateral aspect of the maxilla adjacent to the right maxillary third molar, with a subcentimeter low-density collection (series 3, image 34), most likely a small abscess. Mild fat stranding in the right premaxillary subcutaneous fat. Additional dental caries in the right maxillary second molar Pharynx and larynx: Normal. No mass or swelling. Salivary glands: No inflammation, mass, or stone. Thyroid:  Normal. Lymph nodes: None enlarged or abnormal density. Vascular: Patent. Limited intracranial: Negative. Visualized orbits: Negative. Mastoids and visualized paranasal sinuses: Mucosal thickening in the maxillary sinuses. Otherwise clear paranasal sinuses. The mastoids are well aerated. Skeleton: No additional acute osseous abnormality. Degenerative changes in the cervical spine. Upper chest: No focal pulmonary opacity or pleural effusion. 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 Mild fat stranding in the right premaxillary subcutaneous fat. Electronically Signed   By: Wiliam Ke M.D.   On: 10/09/2022 01:38      Antony Madura, Cordelia Poche 10/09/22 0157    Tilden Fossa, MD 10/09/22 [phone]

## 2022-10-09 NOTE — Discharge Instructions (Addendum)
Follow-up with a dentist.  Only a dentist can fix your problem.  We recommend that you take amoxicillin as prescribed to treat likely underlying infection.  Take Naproxen as prescribed for pain control.  Return to the ED for any new or concerning symptoms.
